# Patient Record
Sex: Male | Born: 1965 | Race: White | Hispanic: No | Smoking: Former smoker
Health system: Southern US, Community
[De-identification: ages and names within clinical notes are randomized; demographics above are authoritative.]

## PROBLEM LIST (undated history)

## (undated) DIAGNOSIS — R51 Headache: Secondary | ICD-10-CM

## (undated) DIAGNOSIS — G43909 Migraine, unspecified, not intractable, without status migrainosus: Secondary | ICD-10-CM

## (undated) DIAGNOSIS — M5020 Other cervical disc displacement, unspecified cervical region: Secondary | ICD-10-CM

## (undated) DIAGNOSIS — K219 Gastro-esophageal reflux disease without esophagitis: Secondary | ICD-10-CM

## (undated) DIAGNOSIS — M1990 Unspecified osteoarthritis, unspecified site: Secondary | ICD-10-CM

## (undated) HISTORY — PX: REPAIR QUADRICEPS / HAMSTRING MUSCLE: SUR1204

## (undated) HISTORY — PX: GANGLION CYST EXCISION: SHX1691

## (undated) HISTORY — PX: CARPAL TUNNEL RELEASE: SHX101

## (undated) HISTORY — DX: Migraine, unspecified, not intractable, without status migrainosus: G43.909

---

## 1993-02-27 HISTORY — PX: SEPTOPLASTY: SUR1290

## 2009-02-27 HISTORY — PX: FOOT ARTHROPLASTY: SHX1657

## 2010-06-09 ENCOUNTER — Inpatient Hospital Stay (INDEPENDENT_AMBULATORY_CARE_PROVIDER_SITE_OTHER)
Admission: RE | Admit: 2010-06-09 | Discharge: 2010-06-09 | Disposition: A | Discharge status: Home / Self Care | Payer: BLUE CROSS/BLUE SHIELD | Source: Ambulatory Visit | Attending: Family Medicine | Admitting: Family Medicine

## 2010-06-09 DIAGNOSIS — A6000 Herpesviral infection of urogenital system, unspecified: Secondary | ICD-10-CM

## 2011-02-28 HISTORY — PX: SHOULDER ARTHROSCOPY W/ ROTATOR CUFF REPAIR: SHX2400

## 2012-02-28 HISTORY — PX: KNEE ARTHROSCOPY: SUR90

## 2012-02-28 HISTORY — PX: REPAIR QUADRICEPS / HAMSTRING MUSCLE: SUR1204

## 2012-11-18 ENCOUNTER — Encounter (HOSPITAL_BASED_OUTPATIENT_CLINIC_OR_DEPARTMENT_OTHER): Payer: Self-pay | Admitting: *Deleted

## 2012-11-18 NOTE — Progress Notes (Signed)
No labs needed

## 2012-11-20 ENCOUNTER — Other Ambulatory Visit: Payer: Self-pay | Admitting: Orthopedic Surgery

## 2012-11-21 ENCOUNTER — Encounter (HOSPITAL_BASED_OUTPATIENT_CLINIC_OR_DEPARTMENT_OTHER)
Admission: RE | Disposition: A | Discharge status: Home / Self Care | Payer: Self-pay | Source: Ambulatory Visit | Attending: Orthopedic Surgery

## 2012-11-21 ENCOUNTER — Encounter (HOSPITAL_BASED_OUTPATIENT_CLINIC_OR_DEPARTMENT_OTHER): Payer: Self-pay | Admitting: Certified Registered Nurse Anesthetist

## 2012-11-21 ENCOUNTER — Ambulatory Visit (HOSPITAL_BASED_OUTPATIENT_CLINIC_OR_DEPARTMENT_OTHER): Payer: BC Managed Care – PPO | Admitting: Certified Registered Nurse Anesthetist

## 2012-11-21 ENCOUNTER — Ambulatory Visit (HOSPITAL_BASED_OUTPATIENT_CLINIC_OR_DEPARTMENT_OTHER)
Admission: RE | Admit: 2012-11-21 | Discharge: 2012-11-21 | Disposition: A | Discharge status: Home / Self Care | Payer: BC Managed Care – PPO | Source: Ambulatory Visit | Attending: Orthopedic Surgery | Admitting: Orthopedic Surgery

## 2012-11-21 ENCOUNTER — Encounter (HOSPITAL_BASED_OUTPATIENT_CLINIC_OR_DEPARTMENT_OTHER): Payer: Self-pay | Admitting: *Deleted

## 2012-11-21 DIAGNOSIS — S92902K Unspecified fracture of left foot, subsequent encounter for fracture with nonunion: Secondary | ICD-10-CM

## 2012-11-21 DIAGNOSIS — IMO0002 Reserved for concepts with insufficient information to code with codable children: Secondary | ICD-10-CM | POA: Insufficient documentation

## 2012-11-21 HISTORY — PX: ORIF TOE FRACTURE: SHX5032

## 2012-11-21 HISTORY — DX: Headache: R51

## 2012-11-21 LAB — POCT HEMOGLOBIN-HEMACUE: Hemoglobin: 14.8 g/dL (ref 13.0–17.0)

## 2012-11-21 SURGERY — OPEN REDUCTION INTERNAL FIXATION (ORIF) METATARSAL (TOE) FRACTURE
Anesthesia: General | Site: Foot | Laterality: Left | Wound class: Clean

## 2012-11-21 MED ORDER — OXYCODONE HCL 5 MG PO TABS: 5.0000 mg | ORAL_TABLET | Freq: Once | ORAL | Status: DC | PRN

## 2012-11-21 MED ORDER — ROPIVACAINE HCL 5 MG/ML IJ SOLN
INTRAMUSCULAR | Status: DC | PRN
  Administered 2012-11-21: 10 mL

## 2012-11-21 MED ORDER — BUPIVACAINE-EPINEPHRINE 0.5% -1:200000 IJ SOLN
INTRAMUSCULAR | Status: DC | PRN
  Administered 2012-11-21: 10 mL

## 2012-11-21 MED ORDER — FENTANYL CITRATE 0.05 MG/ML IJ SOLN
50.0000 ug | INTRAMUSCULAR | Status: DC | PRN
  Administered 2012-11-21: 100 ug via INTRAVENOUS

## 2012-11-21 MED ORDER — ONDANSETRON HCL 4 MG/2ML IJ SOLN
INTRAMUSCULAR | Status: DC | PRN
  Administered 2012-11-21: 4 mg via INTRAVENOUS

## 2012-11-21 MED ORDER — ACETAMINOPHEN 500 MG PO TABS
1000.0000 mg | ORAL_TABLET | Freq: Once | ORAL | Status: AC
  Administered 2012-11-21: 1000 mg via ORAL

## 2012-11-21 MED ORDER — 0.9 % SODIUM CHLORIDE (POUR BTL) OPTIME
TOPICAL | Status: DC | PRN
  Administered 2012-11-21: 200 mL

## 2012-11-21 MED ORDER — BUPIVACAINE-EPINEPHRINE PF 0.5-1:200000 % IJ SOLN
INTRAMUSCULAR | Status: DC | PRN
  Administered 2012-11-21: 30 mL

## 2012-11-21 MED ORDER — BACITRACIN ZINC 500 UNIT/GM EX OINT
TOPICAL_OINTMENT | CUTANEOUS | Status: DC | PRN
  Administered 2012-11-21: 1 via TOPICAL

## 2012-11-21 MED ORDER — MIDAZOLAM HCL 5 MG/5ML IJ SOLN
INTRAMUSCULAR | Status: DC | PRN
  Administered 2012-11-21: 2 mg via INTRAVENOUS

## 2012-11-21 MED ORDER — CHLORHEXIDINE GLUCONATE 4 % EX LIQD: 60.0000 mL | Freq: Once | CUTANEOUS | Status: DC

## 2012-11-21 MED ORDER — MIDAZOLAM HCL 2 MG/2ML IJ SOLN
1.0000 mg | INTRAMUSCULAR | Status: DC | PRN
  Administered 2012-11-21: 2 mg via INTRAVENOUS

## 2012-11-21 MED ORDER — PROPOFOL 10 MG/ML IV BOLUS
INTRAVENOUS | Status: DC | PRN
  Administered 2012-11-21: 300 mg via INTRAVENOUS

## 2012-11-21 MED ORDER — HYDROMORPHONE HCL PF 1 MG/ML IJ SOLN: 0.2500 mg | INTRAMUSCULAR | Status: DC | PRN

## 2012-11-21 MED ORDER — OXYCODONE HCL 5 MG/5ML PO SOLN: 5.0000 mg | Freq: Once | ORAL | Status: DC | PRN

## 2012-11-21 MED ORDER — DEXAMETHASONE SODIUM PHOSPHATE 10 MG/ML IJ SOLN
INTRAMUSCULAR | Status: DC | PRN
  Administered 2012-11-21: 10 mg via INTRAVENOUS

## 2012-11-21 MED ORDER — DEXAMETHASONE SODIUM PHOSPHATE 4 MG/ML IJ SOLN
INTRAMUSCULAR | Status: DC | PRN
  Administered 2012-11-21: 8 mg

## 2012-11-21 MED ORDER — DEXTROSE 5 % IV SOLN
3.0000 g | INTRAVENOUS | Status: AC
  Administered 2012-11-21: 3 g via INTRAVENOUS

## 2012-11-21 MED ORDER — FENTANYL CITRATE 0.05 MG/ML IJ SOLN
INTRAMUSCULAR | Status: DC | PRN
  Administered 2012-11-21 (×2): 50 ug via INTRAVENOUS

## 2012-11-21 MED ORDER — CEFAZOLIN SODIUM-DEXTROSE 2-3 GM-% IV SOLR: 2.0000 g | INTRAVENOUS | Status: DC

## 2012-11-21 MED ORDER — LACTATED RINGERS IV SOLN
INTRAVENOUS | Status: DC
  Administered 2012-11-21 (×2): via INTRAVENOUS

## 2012-11-21 MED ORDER — SODIUM CHLORIDE 0.9 % IV SOLN: INTRAVENOUS | Status: DC

## 2012-11-21 MED ORDER — LIDOCAINE HCL (CARDIAC) 20 MG/ML IV SOLN
INTRAVENOUS | Status: DC | PRN
  Administered 2012-11-21: 60 mg via INTRAVENOUS

## 2012-11-21 MED ORDER — ONDANSETRON HCL 4 MG/2ML IJ SOLN: 4.0000 mg | Freq: Once | INTRAMUSCULAR | Status: DC | PRN

## 2012-11-21 MED ORDER — OXYCODONE HCL 5 MG PO TABS: 5.0000 mg | ORAL_TABLET | ORAL | Status: DC | PRN

## 2012-11-21 SURGICAL SUPPLY — 77 items
BAG DECANTER FOR FLEXI CONT (MISCELLANEOUS) ×2 IMPLANT
BANDAGE ESMARK 6X9 LF (GAUZE/BANDAGES/DRESSINGS) ×1 IMPLANT
BIT DRILL 1.7 (BIT) ×2 IMPLANT
BLADE SURG 15 STRL LF DISP TIS (BLADE) ×2 IMPLANT
BLADE SURG 15 STRL SS (BLADE) ×2
BNDG COHESIVE 4X5 TAN STRL (GAUZE/BANDAGES/DRESSINGS) ×2 IMPLANT
BNDG COHESIVE 6X5 TAN STRL LF (GAUZE/BANDAGES/DRESSINGS) ×2 IMPLANT
BNDG ESMARK 4X9 LF (GAUZE/BANDAGES/DRESSINGS) IMPLANT
BNDG ESMARK 6X9 LF (GAUZE/BANDAGES/DRESSINGS) ×2
CAP PIN ORTHO PINK (CAP) IMPLANT
CHLORAPREP W/TINT 26ML (MISCELLANEOUS) ×2 IMPLANT
CLOTH BEACON ORANGE TIMEOUT ST (SAFETY) ×2 IMPLANT
COVER TABLE BACK 60X90 (DRAPES) ×2 IMPLANT
CUFF TOURNIQUET SINGLE 34IN LL (TOURNIQUET CUFF) ×2 IMPLANT
DECANTER SPIKE VIAL GLASS SM (MISCELLANEOUS) IMPLANT
DRAPE C-ARM 42X72 X-RAY (DRAPES) IMPLANT
DRAPE C-ARMOR (DRAPES) IMPLANT
DRAPE EXTREMITY T 121X128X90 (DRAPE) ×2 IMPLANT
DRAPE OEC MINIVIEW 54X84 (DRAPES) ×2 IMPLANT
DRAPE U-SHAPE 47X51 STRL (DRAPES) ×2 IMPLANT
DRILL BIT 2.4 ×2 IMPLANT
DRSG EMULSION OIL 3X3 NADH (GAUZE/BANDAGES/DRESSINGS) ×2 IMPLANT
DRSG PAD ABDOMINAL 8X10 ST (GAUZE/BANDAGES/DRESSINGS) ×2 IMPLANT
ELECT REM PT RETURN 9FT ADLT (ELECTROSURGICAL) ×2
ELECTRODE REM PT RTRN 9FT ADLT (ELECTROSURGICAL) ×1 IMPLANT
GLOVE BIO SURGEON STRL SZ8 (GLOVE) ×2 IMPLANT
GLOVE BIOGEL PI IND STRL 7.0 (GLOVE) ×1 IMPLANT
GLOVE BIOGEL PI IND STRL 7.5 (GLOVE) ×1 IMPLANT
GLOVE BIOGEL PI IND STRL 8 (GLOVE) ×1 IMPLANT
GLOVE BIOGEL PI INDICATOR 7.0 (GLOVE) ×1
GLOVE BIOGEL PI INDICATOR 7.5 (GLOVE) ×1
GLOVE BIOGEL PI INDICATOR 8 (GLOVE) ×1
GLOVE ECLIPSE 6.5 STRL STRAW (GLOVE) ×2 IMPLANT
GLOVE ECLIPSE 7.0 STRL STRAW (GLOVE) ×2 IMPLANT
GLOVE EXAM NITRILE MD LF STRL (GLOVE) ×2 IMPLANT
GOWN PREVENTION PLUS XLARGE (GOWN DISPOSABLE) ×2 IMPLANT
GOWN PREVENTION PLUS XXLARGE (GOWN DISPOSABLE) ×2 IMPLANT
K-WIRE .054X4 (WIRE) ×2 IMPLANT
NEEDLE HYPO 22GX1.5 SAFETY (NEEDLE) IMPLANT
NEEDLE HYPO 25X1 1.5 SAFETY (NEEDLE) ×2 IMPLANT
PACK BASIN DAY SURGERY FS (CUSTOM PROCEDURE TRAY) ×2 IMPLANT
PAD CAST 4YDX4 CTTN HI CHSV (CAST SUPPLIES) ×1 IMPLANT
PADDING CAST ABS 4INX4YD NS (CAST SUPPLIES)
PADDING CAST ABS COTTON 4X4 ST (CAST SUPPLIES) IMPLANT
PADDING CAST COTTON 4X4 STRL (CAST SUPPLIES) ×1
PADDING CAST COTTON 6X4 STRL (CAST SUPPLIES) ×2 IMPLANT
PENCIL BUTTON HOLSTER BLD 10FT (ELECTRODE) ×2 IMPLANT
PLATE LP T 2.4MM 6H (Plate) ×2 IMPLANT
SANITIZER HAND PURELL 535ML FO (MISCELLANEOUS) ×2 IMPLANT
SCREW CORTICAL 2.4X14 (Screw) ×2 IMPLANT
SCREW LP CORTEX 2.4X20MM (Screw) ×2 IMPLANT
SCREW LP CORTEX 2.4X28MM (Screw) ×2 IMPLANT
SCREW LP LOCKING 2.4X12MM (Screw) ×4 IMPLANT
SCREW LP LOCKING 2.4X18MM (Screw) ×2 IMPLANT
SCREW LP LOCKING 2.4X20MM (Screw) ×2 IMPLANT
SHEET MEDIUM DRAPE 40X70 STRL (DRAPES) ×2 IMPLANT
SLEEVE SCD COMPRESS KNEE MED (MISCELLANEOUS) ×2 IMPLANT
SPLINT FAST PLASTER 5X30 (CAST SUPPLIES) ×20
SPLINT PLASTER CAST FAST 5X30 (CAST SUPPLIES) ×20 IMPLANT
SPONGE GAUZE 4X4 12PLY (GAUZE/BANDAGES/DRESSINGS) ×2 IMPLANT
SPONGE LAP 18X18 X RAY DECT (DISPOSABLE) ×2 IMPLANT
STOCKINETTE 6  STRL (DRAPES) ×1
STOCKINETTE 6 STRL (DRAPES) ×1 IMPLANT
STRIP CLOSURE SKIN 1/2X4 (GAUZE/BANDAGES/DRESSINGS) IMPLANT
SUCTION FRAZIER TIP 10 FR DISP (SUCTIONS) ×2 IMPLANT
SUT ETHILON 3 0 PS 1 (SUTURE) ×2 IMPLANT
SUT MNCRL AB 4-0 PS2 18 (SUTURE) IMPLANT
SUT VIC AB 2-0 SH 18 (SUTURE) IMPLANT
SUT VIC AB 2-0 SH 27 (SUTURE) ×1
SUT VIC AB 2-0 SH 27XBRD (SUTURE) ×1 IMPLANT
SUT VICRYL 4-0 PS2 18IN ABS (SUTURE) IMPLANT
SYR BULB 3OZ (MISCELLANEOUS) ×2 IMPLANT
SYR CONTROL 10ML LL (SYRINGE) ×2 IMPLANT
TOWEL OR 17X24 6PK STRL BLUE (TOWEL DISPOSABLE) ×2 IMPLANT
TOWEL OR NON WOVEN STRL DISP B (DISPOSABLE) ×2 IMPLANT
TUBE CONNECTING 20X1/4 (TUBING) ×2 IMPLANT
UNDERPAD 30X30 INCONTINENT (UNDERPADS AND DIAPERS) ×2 IMPLANT

## 2012-11-21 NOTE — H&P (Signed)
Vincent Hernandez is an 47 y.o. male.   Chief Complaint: left foot pain HPI: 47 y/o male with left foot pain since a fracture in the remote past.  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  He presents now for ORIF with calcaneal bone graft.  Past Medical History  Diagnosis Date  . Medical history non-contributory   . ZOXWRUEA(540.9)     Past Surgical History  Procedure Laterality Date  . Knee arthroscopy  1/14    right  . Shoulder arthroscopy w/ rotator cuff repair  2013    left  . Septoplasty  1995  . Foot arthroplasty  2011    right-screws    History reviewed. No pertinent family history. Social History:  reports that he has never smoked. He does not have any smokeless tobacco history on file. He reports that  drinks alcohol. He reports that he does not use illicit drugs.  Allergies: No Known Allergies  Medications Prior to Admission  Medication Sig Dispense Refill  . B Complex-C (B-COMPLEX WITH VITAMIN C) tablet Take 1 tablet by mouth daily.      . fish oil-omega-3 fatty acids 1000 MG capsule Take 2 g by mouth daily.      . naproxen sodium (ANAPROX) 220 MG tablet Take 220 mg by mouth 2 (two) times daily with a meal.      . SUMAtriptan (IMITREX) 50 MG tablet Take 50 mg by mouth every 2 (two) hours as needed for migraine. May repeat in 2 hours if headache persists or recurs.        Results for orders placed during the hospital encounter of 12/08/2012 (from the past 48 hour(s))  POCT HEMOGLOBIN-HEMACUE     Status: None   Collection Time    2012-12-08  6:41 AM      Result Value Range   Hemoglobin 14.8  13.0 - 17.0 g/dL   No results found.  ROS  No recent f/c/n/v/wt loss  Blood pressure 131/82, pulse 74, temperature 97.9 F (36.6 C), temperature source Oral, resp. rate 18, height 5\' 10"  (1.778 m), weight 121.791 kg (268 lb 8 oz), SpO2 96.00%. Physical Exam  wn wd male in nad.  A and O x 4.  Mood and affect normal.  EOMI.  Resp unlabored.  L foot with healthy  skin.  TTP at 4th MT base.  Sens to LT intact at foot.  2+ dp and pt pulses.  5/5 srength in DF and PF of the ankle.  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  The risks and benefits of the alternative treatment options have been discussed in detail.  The patient wishes to proceed with surgery and specifically understands risks of bleeding, infection, nerve damage, blood clots, need for additional surgery, amputation and death.   Toni Arthurs 12/08/2012, 7:18 AM

## 2012-11-21 NOTE — Progress Notes (Signed)
Assisted Dr. Crews with left, ultrasound guided, popliteal/saphenous block. Side rails up, monitors on throughout procedure. See vital signs in flow sheet. Tolerated Procedure well. 

## 2012-11-21 NOTE — Anesthesia Preprocedure Evaluation (Addendum)
Anesthesia Evaluation  Patient identified by MRN, date of birth, ID band Patient awake    Reviewed: Allergy & Precautions, NPO status   Airway Mallampati: I TM Distance: >3 FB Neck ROM: Full    Dental  (+) Teeth Intact and Dental Advisory Given   Pulmonary  breath sounds clear to auscultation        Cardiovascular Rhythm:Regular Rate:Normal     Neuro/Psych  Headaches,    GI/Hepatic   Endo/Other  Morbid obesity  Renal/GU      Musculoskeletal   Abdominal   Peds  Hematology   Anesthesia Other Findings   Reproductive/Obstetrics                          Anesthesia Physical Anesthesia Plan  ASA: II  Anesthesia Plan: General   Post-op Pain Management:    Induction: Intravenous  Airway Management Planned: LMA  Additional Equipment:   Intra-op Plan:   Post-operative Plan: Extubation in OR  Informed Consent: I have reviewed the patients History and Physical, chart, labs and discussed the procedure including the risks, benefits and alternatives for the proposed anesthesia with the patient or authorized representative who has indicated his/her understanding and acceptance.   Dental advisory given  Plan Discussed with: CRNA, Anesthesiologist and Surgeon  Anesthesia Plan Comments:         Anesthesia Quick Evaluation

## 2012-11-21 NOTE — Brief Op Note (Signed)
11/21/2012  9:31 AM  PATIENT:  Vincent Hernandez  47 y.o. male  PRE-OPERATIVE DIAGNOSIS:  Left Fourth Metatarsal Nonunion  POST-OPERATIVE DIAGNOSIS:  Left Fourth Metatarsal Nonunion  Procedure(s): 1.  Treatment of left 4th metatarsal nonunion with open reduction and internal fixation 2.  AP, lateral and oblique radiographs of the left foot  SURGEON:  Toni Arthurs, MD  ASSISTANT: n/a  ANESTHESIA:   General, regional  EBL:  minimal   TOURNIQUET:   Total Tourniquet Time Documented: Thigh (Left) - 77 minutes Total: Thigh (Left) - 77 minutes   COMPLICATIONS:  None apparent  DISPOSITION:  Extubated, awake and stable to recovery.  DICTATION ID:  [id number]

## 2012-11-21 NOTE — Anesthesia Postprocedure Evaluation (Signed)
  Anesthesia Post-op Note  Patient: Vincent Hernandez  Procedure(s) Performed: Procedure(s): OPEN REDUCTION INTERNAL FIXATION (ORIF) LEFT FOURTH METATARSAL NONUNION FRACTURE (Left)  Patient Location: PACU  Anesthesia Type:GA combined with regional for post-op pain  Level of Consciousness: awake, alert  and oriented  Airway and Oxygen Therapy: Patient Spontanous Breathing  Post-op Pain: none  Post-op Assessment: Post-op Vital signs reviewed  Post-op Vital Signs: Reviewed  Complications: No apparent anesthesia complications

## 2012-11-21 NOTE — Transfer of Care (Signed)
Immediate Anesthesia Transfer of Care Note  Patient: Vincent Hernandez  Procedure(s) Performed: Procedure(s): OPEN REDUCTION INTERNAL FIXATION (ORIF) LEFT FOURTH METATARSAL NONUNION FRACTURE (Left)  Patient Location: PACU  Anesthesia Type:GA combined with regional for post-op pain  Level of Consciousness: awake and patient cooperative  Airway & Oxygen Therapy: Patient Spontanous Breathing and Patient connected to face mask oxygen  Post-op Assessment: Report given to PACU RN and Post -op Vital signs reviewed and stable  Post vital signs: Reviewed and stable  Complications: No apparent anesthesia complications

## 2012-11-21 NOTE — Anesthesia Procedure Notes (Addendum)
Anesthesia Regional Block:  Popliteal block  Pre-Anesthetic Checklist: ,, timeout performed, Correct Patient, Correct Site, Correct Laterality, Correct Procedure, Correct Position, site marked, Risks and benefits discussed,  Surgical consent,  Pre-op evaluation,  At surgeon's request and post-op pain management  Laterality: Left and Lower  Prep: chloraprep       Needles:  Injection technique: Single-shot  Needle Type: Echogenic Needle     Needle Length: 9cm  Needle Gauge: 21 and 21 G    Additional Needles:  Procedures: ultrasound guided (picture in chart) Popliteal block Narrative:  Start time: 11/21/2012 7:10 AM End time: 11/21/2012 7:24 AM Injection made incrementally with aspirations every 5 mL.  Performed by: Personally  Anesthesiologist: Sheldon Silvan, MD  Additional Notes: After the Popliteal block was complete, a L Saphenous block was done in a similar fashion.  Popliteal block Procedure Name: LMA Insertion Date/Time: 11/21/2012 7:35 AM Performed by: Audrinna Sherman D Pre-anesthesia Checklist: Patient identified, Emergency Drugs available, Suction available and Patient being monitored Patient Re-evaluated:Patient Re-evaluated prior to inductionOxygen Delivery Method: Circle System Utilized Preoxygenation: Pre-oxygenation with 100% oxygen Intubation Type: IV induction Ventilation: Mask ventilation without difficulty LMA: LMA inserted LMA Size: 5.0 Number of attempts: 1 Airway Equipment and Method: bite block Placement Confirmation: positive ETCO2 Tube secured with: Tape Dental Injury: Teeth and Oropharynx as per pre-operative assessment

## 2012-11-22 NOTE — Op Note (Signed)
NAME:  Vincent Hernandez, Vincent Hernandez NO.:  [id number]  MEDICAL RECORD NO.:  [id number]  LOCATION:                                 FACILITY:  PHYSICIAN:  Toni Arthurs, MD             DATE OF BIRTH:  DATE OF PROCEDURE:  11/21/2012 DATE OF DISCHARGE:                              OPERATIVE REPORT   PREOPERATIVE DIAGNOSIS:  Left fourth metatarsal nonunion.  POSTOPERATIVE DIAGNOSIS:  Left fourth metatarsal nonunion.  PROCEDURE: 1. Treatment of left fourth metatarsal nonunion with open reduction     and internal fixation. 2. Anteroposterior, lateral, and oblique radiographs of the left foot.  SURGEON:  Toni Arthurs, MD  ANESTHESIA:  General, regional.  ESTIMATED BLOOD LOSS:  Minimal.  TOURNIQUET TIME:  77 minutes 220 mmHg.  COMPLICATIONS:  None apparent.  DISPOSITION:  Extubated awake and stable to recovery.  INDICATIONS FOR PROCEDURE:  The patient is a 47 year old male who had a crush injury to his left foot over a year ago.  He has persistent pain in that area.  He has radiographic and MRI evidence of a nonunion of the fourth metatarsal fracture.  He presents now for treatment of this nonunion with open reduction and internal fixation.  He understands the risks and benefits, the alternative treatment options and elects surgical treatment.  He specifically understands the risks of bleeding, infection, nerve damage, blood clots, need for additional surgery, amputation, and death.  PROCEDURE IN DETAIL:  After preoperative consent was obtained and the correct operative site was identified, the patient was brought to the operating room and placed supine on the operating table.  General anesthesia was induced.  Preoperative antibiotics were administered. Surgical time-out was taken.  The left lower extremity was prepped and draped in standard sterile fashion with the tourniquet around the thigh. The extremity was exsanguinated and the tourniquet was inflated to 220 mmHg.   A longitudinal incision was made over the fourth metatarsal shaft.  Sharp dissection was carried down through the skin and subcutaneous tissue.  Blunt dissection was then carried down through the extensor tendons to expose the site of the nonunion.  The tip of #15 blade was used to identify the nonunion site.  The nonunion was cleaned of all fibrous tissue with a rongeur and the curette.  The bone was contoured to allow application of a plate dorsally across the fracture site.  The fracture site was mobilized.  The wound was irrigated copiously.  A small drill bit was used to perforate the fracture callus on both sides of the nonunion site, leaving the resultant bone graft in place.  The fracture was reduced and held with a tenaculum.  AP, lateral, and oblique fluoroscopic radiographs showed appropriate alignment of the fracture site.  A 2.4 mm lag screw was then inserted across the fracture site.  It was noted to have excellent purchase.  A 7- hole T-plate from the [employer] foot plate set was selected.  It was fixed proximally with a nonlocking screw and distally with a nonlocking screw after contouring the plate appropriately.  These screws pulled the plate securely down to the bone.  The 2 holes  in the T-portion of the plate were then drilled and filled with locking screws.  The remaining 2 holes in the distal portion of the plate were drilled and filled with locking screws in bicortical fashion.  The compression slot was left empty over the lag screw.  The wound was again irrigated copiously.  AP, lateral, and oblique radiograph showed appropriate position and length of all hardware and appropriate reduction  of the nonunion site.  A bone graft that was removed from the fracture callus was then packed in the remaining gaps in the fracture site.  Subcutaneous tissue was approximated with inverted simple sutures of 3-0 Monocryl and a running 3-0 nylon was used to close the skin incision.   Sterile dressings were applied, followed by a well-padded short-leg splint.  Tourniquet was released at 77 minutes after application of the dressings.  The patient was awakened from Anesthesia and transported to the recovery room in stable condition.  The skin had been infiltrated with 0.5% plain Marcaine after closure of the wound.  FOLLOWUP PLAN:  The patient will be nonweightbearing on the left lower extremity.  He will follow up with me in 2 weeks for suture removal and conversion to a cast.  RADIOGRAPHS:  AP, lateral, and oblique radiographs of the left foot were obtained today intraoperatively.  These show interval reduction of the nonunion site with application of a lag screw and a neutralization plate.  No other fracture or dislocation or malalignment is noted.     Toni Arthurs, MD     JH/MEDQ  D:  11/21/2012  T:  11/22/2012  Job:  [id number]

## 2012-11-26 ENCOUNTER — Encounter (HOSPITAL_BASED_OUTPATIENT_CLINIC_OR_DEPARTMENT_OTHER): Payer: Self-pay | Admitting: Orthopedic Surgery

## 2014-02-27 HISTORY — PX: MENISCUS REPAIR: SHX5179

## 2015-07-22 ENCOUNTER — Emergency Department (HOSPITAL_COMMUNITY): Payer: 59

## 2015-07-22 ENCOUNTER — Encounter (HOSPITAL_COMMUNITY): Payer: Self-pay | Admitting: Emergency Medicine

## 2015-07-22 ENCOUNTER — Emergency Department (HOSPITAL_COMMUNITY)
Admission: EM | Admit: 2015-07-22 | Discharge: 2015-07-23 | Disposition: A | Discharge status: Home / Self Care | Payer: 59 | Attending: Emergency Medicine | Admitting: Emergency Medicine

## 2015-07-22 DIAGNOSIS — R079 Chest pain, unspecified: Secondary | ICD-10-CM | POA: Insufficient documentation

## 2015-07-22 DIAGNOSIS — Z79899 Other long term (current) drug therapy: Secondary | ICD-10-CM | POA: Diagnosis not present

## 2015-07-22 DIAGNOSIS — Z7982 Long term (current) use of aspirin: Secondary | ICD-10-CM | POA: Insufficient documentation

## 2015-07-22 DIAGNOSIS — M7989 Other specified soft tissue disorders: Secondary | ICD-10-CM | POA: Insufficient documentation

## 2015-07-22 DIAGNOSIS — R110 Nausea: Secondary | ICD-10-CM | POA: Insufficient documentation

## 2015-07-22 DIAGNOSIS — G43909 Migraine, unspecified, not intractable, without status migrainosus: Secondary | ICD-10-CM | POA: Diagnosis not present

## 2015-07-22 DIAGNOSIS — R350 Frequency of micturition: Secondary | ICD-10-CM | POA: Diagnosis not present

## 2015-07-22 DIAGNOSIS — M546 Pain in thoracic spine: Secondary | ICD-10-CM | POA: Diagnosis not present

## 2015-07-22 DIAGNOSIS — M542 Cervicalgia: Secondary | ICD-10-CM | POA: Insufficient documentation

## 2015-07-22 DIAGNOSIS — I7100 Dissection of unspecified site of aorta: Secondary | ICD-10-CM

## 2015-07-22 DIAGNOSIS — M549 Dorsalgia, unspecified: Secondary | ICD-10-CM

## 2015-07-22 LAB — BASIC METABOLIC PANEL
ANION GAP: 8 (ref 5–15)
BUN: 17 mg/dL (ref 6–20)
CALCIUM: 9.1 mg/dL (ref 8.9–10.3)
CO2: 25 mmol/L (ref 22–32)
CREATININE: 0.84 mg/dL (ref 0.61–1.24)
Chloride: 103 mmol/L (ref 101–111)
GFR calc non Af Amer: >60 mL/min (ref >60)
Glucose, Bld: 88 mg/dL (ref 65–99)
Potassium: 3.9 mmol/L (ref 3.5–5.1)
Sodium: 136 mmol/L (ref 135–145)

## 2015-07-22 LAB — I-STAT TROPONIN, ED: Troponin i, poc: 0 ng/mL (ref 0.00–0.08)

## 2015-07-22 LAB — CBC
HCT: 42.6 % (ref 39.0–52.0)
Hemoglobin: 15 g/dL (ref 13.0–17.0)
MCH: 31.8 pg (ref 26.0–34.0)
MCHC: 35.2 g/dL (ref 30.0–36.0)
MCV: 90.4 fL (ref 78.0–100.0)
PLATELETS: 281 10*3/uL (ref 150–400)
RBC: 4.71 MIL/uL (ref 4.22–5.81)
RDW: 12.7 % (ref 11.5–15.5)
WBC: 9.7 10*3/uL (ref 4.0–10.5)

## 2015-07-22 MED ORDER — ASPIRIN 81 MG PO CHEW
325.0000 mg | CHEWABLE_TABLET | Freq: Once | ORAL | Status: AC
  Administered 2015-07-22: 325 mg via ORAL
  Filled 2015-07-22: qty 5

## 2015-07-22 MED ORDER — SODIUM CHLORIDE 0.9 % IV BOLUS (SEPSIS)
1000.0000 mL | Freq: Once | INTRAVENOUS | Status: AC
  Administered 2015-07-22: 1000 mL via INTRAVENOUS

## 2015-07-22 MED ORDER — MORPHINE SULFATE (PF) 2 MG/ML IV SOLN: 2.0000 mg | Freq: Once | INTRAVENOUS | Status: DC

## 2015-07-22 MED ORDER — NITROGLYCERIN 0.4 MG SL SUBL
0.4000 mg | SUBLINGUAL_TABLET | SUBLINGUAL | Status: DC | PRN
  Administered 2015-07-22 (×2): 0.4 mg via SUBLINGUAL
  Filled 2015-07-22: qty 1

## 2015-07-22 MED ORDER — MORPHINE SULFATE (PF) 2 MG/ML IV SOLN
2.0000 mg | Freq: Once | INTRAVENOUS | Status: AC
  Administered 2015-07-22: 2 mg via INTRAVENOUS
  Filled 2015-07-22: qty 1

## 2015-07-22 MED ORDER — KETOROLAC TROMETHAMINE 30 MG/ML IJ SOLN
30.0000 mg | Freq: Once | INTRAMUSCULAR | Status: AC
  Administered 2015-07-23: 30 mg via INTRAVENOUS
  Filled 2015-07-22: qty 1

## 2015-07-22 NOTE — ED Provider Notes (Signed)
CSN: [id number]     Arrival date & time 07/22/15  1903 History   First MD Initiated Contact with Patient 07/22/15 1922     Chief Complaint  Patient presents with  . Chest Pain     (Consider location/radiation/quality/duration/timing/severity/associated sxs/prior Treatment) HPI Comments: Vincent Hernandez is a 50 y.o. male presents to ED with complaint of left-sided chest pain. Patient states pain initially started three days ago in left shoulder and upper back, was intermittent in nature, and occasionally sharp. Today, patient states pain radiated into left chest. Pain is constant 4/10 described as a tightness and squeezing sensation. Associated symptoms include left-sided neck pain and nausea. Patient denies fever, chills, night sweats. He denies shortness of breath and cough. Of note patient endorses migraine headache that started 4 days ago. Headache was gradual in onset, 5-6/10, with photophobia. Patient states very typical for his migraine headaches. Denies paresthesias, weakness, or loss of consciousness. No dizziness or lightheadedness. He denies any cardiac history, hypertension, diabetes, or high cholesterol. Family history of father with stroke in 56s.   Patient is a 50 y.o. male presenting with chest pain. The history is provided by the patient and the spouse.  Chest Pain Associated symptoms: headache ( h/o migraine headache, gradual in onset, 5-6/10, very typical of his migraine headache) and nausea   Associated symptoms: no abdominal pain, no cough, no diaphoresis, no dizziness, no dysphagia, no fever, no numbness, no shortness of breath, not vomiting and no weakness     Past Medical History  Diagnosis Date  . Medical history non-contributory   . [id number])    Past Surgical History  Procedure Laterality Date  . Knee arthroscopy  1/14    right  . Shoulder arthroscopy w/ rotator cuff repair  2013    left  . Septoplasty  1995  . Foot arthroplasty  2011    right-screws  .  Orif toe fracture Left 11/21/2012    Procedure: OPEN REDUCTION INTERNAL FIXATION (ORIF) LEFT FOURTH METATARSAL NONUNION FRACTURE;  Surgeon: Wylene Simmer, MD;  Location: [facility];  Service: Orthopedics;  Laterality: Left;   No family history on file. Social History  Substance Use Topics  . Smoking status: Never Smoker   . Smokeless tobacco: None  . Alcohol Use: Yes     Comment: occ    Review of Systems  Constitutional: Negative for fever, chills and diaphoresis.  HENT: Negative for sore throat and trouble swallowing.   Eyes: Positive for photophobia ( secondary to migraine). Negative for visual disturbance.  Respiratory: Negative for cough and shortness of breath.   Cardiovascular: Positive for chest pain and leg swelling ( b/l).  Gastrointestinal: Positive for nausea. Negative for vomiting, abdominal pain, diarrhea, constipation and blood in stool.  Genitourinary: Positive for frequency. Negative for dysuria and hematuria.  Musculoskeletal: Positive for neck pain ( left). Negative for neck stiffness.  Skin: Negative for rash.  Allergic/Immunologic: Negative for immunocompromised state.  Neurological: Positive for headaches ( h/o migraine headache, gradual in onset, 5-6/10, very typical of his migraine headache). Negative for dizziness, syncope, weakness, light-headedness and numbness.      Allergies  Robaxin  Home Medications   Prior to Admission medications   Medication Sig Start Date End Date Taking? Authorizing Provider  aspirin 81 MG tablet Take 81 mg by mouth daily.   Yes Historical Provider, MD  aspirin-acetaminophen-caffeine (EXCEDRIN MIGRAINE) [phone] MG tablet Take 1 tablet by mouth every 6 (six) hours as needed for headache.   Yes Historical  Provider, MD  B Complex-C (B-COMPLEX WITH VITAMIN C) tablet Take 1 tablet by mouth daily.   Yes Historical Provider, MD  CRANBERRY PO Take by mouth.   Yes Historical Provider, MD  fish oil-omega-3 fatty acids  1000 MG capsule Take 2 g by mouth daily.   Yes Historical Provider, MD  Glucosamine HCl (GLUCOSAMINE PO) Take 1 tablet by mouth daily.   Yes Historical Provider, MD  Multiple Vitamin (MULTIVITAMIN WITH MINERALS) TABS tablet Take 1 tablet by mouth daily.   Yes Historical Provider, MD  SUMAtriptan (IMITREX) 50 MG tablet Take 50 mg by mouth every 2 (two) hours as needed for migraine. May repeat in 2 hours if headache persists or recurs.   Yes Historical Provider, MD  oxyCODONE (ROXICODONE) 5 MG immediate release tablet Take 1-2 tablets (5-10 mg total) by mouth every 4 (four) hours as needed for pain. Patient not taking: Reported on 07/22/2015 11/21/12   Wylene Simmer, MD   BP 90/61 mmHg  Pulse 64  Temp(Src) 98 F (36.7 C) (Oral)  Resp 17  Ht 5\' 10"  (1.778 m)  Wt 128.822 kg  BMI 40.75 kg/m2  SpO2 96% Physical Exam  Constitutional: He appears well-developed and well-nourished. No distress.  HENT:  Head: Normocephalic and atraumatic.  Mouth/Throat: Oropharynx is clear and moist. No oropharyngeal exudate.  Eyes: Conjunctivae and EOM are normal. Pupils are equal, round, and reactive to light. Right eye exhibits no discharge. Left eye exhibits no discharge. No scleral icterus.  Neck: Normal range of motion and phonation normal. Neck supple. No spinous process tenderness and no muscular tenderness present. No rigidity. Normal range of motion present.  Cardiovascular: Normal rate, regular rhythm, normal heart sounds and intact distal pulses.   No murmur heard. No carotid bruits heard. Pulses are 2+ and equal bilaterally in upper and lower extremities.   Pulmonary/Chest: Effort normal and breath sounds normal. No respiratory distress. He exhibits no tenderness.  Abdominal: Soft. Bowel sounds are normal. There is no tenderness. There is no rebound, no guarding and no CVA tenderness.  Musculoskeletal: Normal range of motion. He exhibits no tenderness.  Lymphadenopathy:    He has no cervical adenopathy.   Neurological: He is alert. Coordination normal.  Mental Status:  Alert, oriented, thought content appropriate, able to give a coherent history. Speech fluent without evidence of aphasia. Able to follow 2 step commands without difficulty.  Cranial Nerves:  II:  Peripheral visual fields grossly normal, pupils equal, round, reactive to light III,IV, VI: ptosis not present, extra-ocular motions intact bilaterally  V,VII: smile symmetric, facial light touch sensation equal VIII: hearing grossly normal to voice  X: uvula elevates symmetrically  XI: bilateral shoulder shrug symmetric and strong XII: midline tongue extension without fassiculations Motor:  Normal tone. 5/5 in upper and lower extremities bilaterally including strong and equal grip strength and dorsiflexion/plantar flexion Sensory: Pinprick and light touch normal in all extremities.  Cerebellar: normal finger-to-nose with bilateral upper extremities CV: distal pulses palpable throughout    Skin: Skin is warm and dry. He is not diaphoretic.  Psychiatric: He has a normal mood and affect. His behavior is normal.    ED Course  Procedures (including critical care time) Labs Review Labs Reviewed  BASIC METABOLIC PANEL  CBC  TROPONIN I  Randolm Idol, ED    Imaging Review Dg Chest 2 View  07/22/2015  CLINICAL DATA:  Left-sided chest wall pain. EXAM: CHEST  2 VIEW COMPARISON:  None. FINDINGS: Cardiomediastinal silhouette is normal. Mediastinal contours appear intact.  There is no evidence of focal airspace consolidation, pleural effusion or pneumothorax. Osseous structures are without acute abnormality. Soft tissues are grossly normal. IMPRESSION: No active cardiopulmonary disease. Electronically Signed   By: Fidela Salisbury M.D.   On: 07/22/2015 19:49   I have personally reviewed and evaluated these images and lab results as part of my medical decision-making.   EKG Interpretation   Date/Time:  Thursday Jul 22 2015  21:01:03 EDT Ventricular Rate:  73 PR Interval:  191 QRS Duration: 95 QT Interval:  388 QTC Calculation: 427 R Axis:   79 Text Interpretation:  Sinus rhythm Consider left atrial enlargement No  significant change since last tracing Confirmed by NGUYEN, EMILY ([id number])  on 07/22/2015 9:07:50 PM      MDM   Final diagnoses:  Chest pain, unspecified chest pain type  Left-sided back pain, unspecified location    Patient is afebrile and nontoxic. Vital signs are stable. BMP unremarkable. CBC unremarkable. Chest x-ray negative for consolidation, pleural effusion, or pneumothorax. Well's Criteria for PE is 0, doubt PE.. Doubt aortic dissection-equal pulses bilaterally no dilation of the aorta on chest x-ray. Doubt esophageal rupture-no free air under diaphragm. Troponin negative. EKG unremarkable. Heart score 3, risk of MACE 0.9-1.7%. 325mg  aspirin given. Will repeat troponin 3 hours.   Patient was given 2 doses of sublingual nitroglycerin. Patient reports worsening of pain. Described as sharp, located in the back between his spine and scapula. Patient endorsed some paresthesias in left arm. Patient given morphine. Endorsed improvement in pain. Patient given Toradol for headache. Given history of back pain and chest pain, will CT angiography to rule out dissection.  Repeat troponin negative. CT angiography negative for aortic aneurysm, aortic dissection, or pulmonary embolism. Possible musculoskeletal in nature. Will have patient follow up with primary care provider in the next 3 days. Discussed return precautions. Encourage follow-up with primary care doctor. Patient voiced understanding and is agreeable.       Roxanna Mew, PA-C 07/23/15 0231  Harvel Quale, MD 07/25/15 1455

## 2015-07-22 NOTE — ED Notes (Signed)
Pt. reports left chest pain onset this evening radiating to left shoulder , left axilla and left upper arm , denies SOB , no nausea or diaphoresis .

## 2015-07-23 ENCOUNTER — Emergency Department (HOSPITAL_COMMUNITY): Payer: 59

## 2015-07-23 ENCOUNTER — Encounter (HOSPITAL_COMMUNITY): Payer: Self-pay | Admitting: Radiology

## 2015-07-23 DIAGNOSIS — R079 Chest pain, unspecified: Secondary | ICD-10-CM | POA: Diagnosis not present

## 2015-07-23 LAB — TROPONIN I: Troponin I: 0.03 ng/mL (ref <0.031)

## 2015-07-23 MED ORDER — IOPAMIDOL (ISOVUE-370) INJECTION 76%
INTRAVENOUS | Status: AC
  Administered 2015-07-23: 100 mL
  Filled 2015-07-23: qty 100

## 2015-07-23 MED ORDER — NAPROXEN 500 MG PO TABS: 500.0000 mg | ORAL_TABLET | Freq: Two times a day (BID) | ORAL | Status: DC

## 2015-07-23 NOTE — ED Notes (Signed)
RN called CT scan to inquire about time of patient scan; CT staff reported 4 patient ahead of this patient; patient updated

## 2015-07-23 NOTE — Discharge Instructions (Signed)
Read the information below.   Your cardiac enzyme was negative. Your chest x-ray was negative for pneumonia, effusion, and pneumothorax. Your CT was negative for aortic dissection, aneurysm, or pulmonary embolus.  Use the prescribed medication as directed.  Please discuss all new medications with your pharmacist.  You are being prescribed naprosyn and anti-inflammatory and pain reliever. Do not take other NSAIDs (e.g. Motrin, aleeve, ibuprofen while taking naprosyn).  It is important for you to follow up with your primary care provider in the next 1-2 days for re-evaluation and possible referral to cardiology. It may be deemed that you need further evaluation and work up.  You may return to the Emergency Department at any time for worsening condition or any new symptoms that concern you. Return to the ED if you develop worsening chest pain, shortness of breath, unilateral leg swelling, neurologic symptoms such as slurred speech, facial droop, numbness/weakness.   Results for orders placed or performed during the hospital encounter of [id number]  Basic metabolic panel  Result Value Ref Range   Sodium 136 135 - 145 mmol/L   Potassium 3.9 3.5 - 5.1 mmol/L   Chloride 103 101 - 111 mmol/L   CO2 25 22 - 32 mmol/L   Glucose, Bld 88 65 - 99 mg/dL   BUN 17 6 - 20 mg/dL   Creatinine, Ser 0.84 0.61 - 1.24 mg/dL   Calcium 9.1 8.9 - 10.3 mg/dL   GFR calc non Af Amer >60 >60 mL/min   GFR calc Af Amer >60 >60 mL/min   Anion gap 8 5 - 15  CBC  Result Value Ref Range   WBC 9.7 4.0 - 10.5 K/uL   RBC 4.71 4.22 - 5.81 MIL/uL   Hemoglobin 15.0 13.0 - 17.0 g/dL   HCT 42.6 39.0 - 52.0 %   MCV 90.4 78.0 - 100.0 fL   MCH 31.8 26.0 - 34.0 pg   MCHC 35.2 30.0 - 36.0 g/dL   RDW 12.7 11.5 - 15.5 %   Platelets 281 150 - 400 K/uL  Troponin I  Result Value Ref Range   Troponin I 0.03 <0.031 ng/mL  I-stat troponin, ED  Result Value Ref Range   Troponin i, poc 0.00 0.00 - 0.08 ng/mL   Comment 3           Dg Chest  2 View  07/22/2015  CLINICAL DATA:  Left-sided chest wall pain. EXAM: CHEST  2 VIEW COMPARISON:  None. FINDINGS: Cardiomediastinal silhouette is normal. Mediastinal contours appear intact. There is no evidence of focal airspace consolidation, pleural effusion or pneumothorax. Osseous structures are without acute abnormality. Soft tissues are grossly normal. IMPRESSION: No active cardiopulmonary disease. Electronically Signed   By: Fidela Salisbury M.D.   On: 07/22/2015 19:49   Ct Angio Chest/abd/pel For Dissection W And/or W/wo  07/23/2015  CLINICAL DATA:  Acute onset of left-sided chest pain, radiating to the left shoulder, left axilla and left upper arm. Initial encounter. EXAM: CT ANGIOGRAPHY CHEST, ABDOMEN AND PELVIS TECHNIQUE: Multidetector CT imaging through the chest, abdomen and pelvis was performed using the standard protocol during bolus administration of intravenous contrast. Multiplanar reconstructed images and MIPs were obtained and reviewed to evaluate the vascular anatomy. CONTRAST:  100 mL of Isovue 370 IV contrast COMPARISON:  Chest radiograph from 07/22/2015 FINDINGS: CTA CHEST FINDINGS There is no evidence of aortic dissection. There is no evidence of aneurysmal dilatation. No calcific atherosclerotic disease is seen. The great vessels are grossly unremarkable in appearance. There is no  evidence of pulmonary embolus. The lungs are essentially clear bilaterally. There is no evidence of significant focal consolidation, pleural effusion or pneumothorax. No masses are identified; no abnormal focal contrast enhancement is seen. The mediastinum is unremarkable in appearance. No mediastinal lymphadenopathy is seen. No pericardial effusion is identified. The great vessels are grossly unremarkable in appearance. After No axillary lymphadenopathy is seen. The visualized portions of the thyroid gland are unremarkable in appearance. No acute osseous abnormalities are seen. Review of the MIP images  confirms the above findings. CTA ABDOMEN AND PELVIS FINDINGS There is no evidence of aortic dissection. There is no evidence of aneurysmal dilatation. No calcific atherosclerotic disease is seen. The celiac trunk, superior mesenteric artery, bilateral renal arteries and inferior mesenteric artery are grossly unremarkable in appearance. The inferior vena cava is grossly unremarkable in appearance. A 0.9 cm hypodensity is noted at the right hepatic lobe. The liver and spleen are otherwise unremarkable. The gallbladder is within normal limits. The pancreas and adrenal glands are unremarkable. The kidneys are unremarkable in appearance. There is no evidence of hydronephrosis. No renal or ureteral stones are seen. No perinephric stranding is appreciated. No free fluid is identified. The small bowel is unremarkable in appearance. The stomach is within normal limits. No acute vascular abnormalities are seen. The appendix is normal in caliber, without evidence of appendicitis. The colon is largely decompressed and is grossly unremarkable in appearance. The bladder is mildly distended and grossly unremarkable. The prostate remains normal in size, with minimal calcification. No inguinal lymphadenopathy is seen. No acute osseous abnormalities are identified. Review of the MIP images confirms the above findings. IMPRESSION: 1. No evidence of aortic dissection. No evidence of aneurysmal dilatation. No calcific atherosclerotic disease seen. 2. No evidence of pulmonary embolus. 3. Lungs clear bilaterally. 4. 0.9 cm hypodensity in the right hepatic lobe. This is nonspecific, but may reflect a small cyst. Electronically Signed   By: Garald Balding M.D.   On: 07/23/2015 02:02     Nonspecific Chest Pain  Chest pain can be caused by many different conditions. There is always a chance that your pain could be related to something serious, such as a heart attack or a blood clot in your lungs. Chest pain can also be caused by  conditions that are not life-threatening. If you have chest pain, it is very important to follow up with your health care provider. CAUSES  Chest pain can be caused by:  Heartburn.  Pneumonia or bronchitis.  Anxiety or stress.  Inflammation around your heart (pericarditis) or lung (pleuritis or pleurisy).  A blood clot in your lung.  A collapsed lung (pneumothorax). It can develop suddenly on its own (spontaneous pneumothorax) or from trauma to the chest.  Shingles infection (varicella-zoster virus).  Heart attack.  Damage to the bones, muscles, and cartilage that make up your chest wall. This can include:  Bruised bones due to injury.  Strained muscles or cartilage due to frequent or repeated coughing or overwork.  Fracture to one or more ribs.  Sore cartilage due to inflammation (costochondritis). RISK FACTORS  Risk factors for chest pain may include:  Activities that increase your risk for trauma or injury to your chest.  Respiratory infections or conditions that cause frequent coughing.  Medical conditions or overeating that can cause heartburn.  Heart disease or family history of heart disease.  Conditions or health behaviors that increase your risk of developing a blood clot.  Having had chicken pox (varicella zoster). SIGNS AND SYMPTOMS Chest  pain can feel like:  Burning or tingling on the surface of your chest or deep in your chest.  Crushing, pressure, aching, or squeezing pain.  Dull or sharp pain that is worse when you move, cough, or take a deep breath.  Pain that is also felt in your back, neck, shoulder, or arm, or pain that spreads to any of these areas. Your chest pain may come and go, or it may stay constant. DIAGNOSIS Lab tests or other studies may be needed to find the cause of your pain. Your health care provider may have you take a test called an ambulatory ECG (electrocardiogram). An ECG records your heartbeat patterns at the time the test  is performed. You may also have other tests, such as:  Transthoracic echocardiogram (TTE). During echocardiography, sound waves are used to create a picture of all of the heart structures and to look at how blood flows through your heart.  Transesophageal echocardiogram (TEE).This is a more advanced imaging test that obtains images from inside your body. It allows your health care provider to see your heart in finer detail.  Cardiac monitoring. This allows your health care provider to monitor your heart rate and rhythm in real time.  Holter monitor. This is a portable device that records your heartbeat and can help to diagnose abnormal heartbeats. It allows your health care provider to track your heart activity for several days, if needed.  Stress tests. These can be done through exercise or by taking medicine that makes your heart beat more quickly.  Blood tests.  Imaging tests. TREATMENT  Your treatment depends on what is causing your chest pain. Treatment may include:  Medicines. These may include:  Acid blockers for heartburn.  Anti-inflammatory medicine.  Pain medicine for inflammatory conditions.  Antibiotic medicine, if an infection is present.  Medicines to dissolve blood clots.  Medicines to treat coronary artery disease.  Supportive care for conditions that do not require medicines. This may include:  Resting.  Applying heat or cold packs to injured areas.  Limiting activities until pain decreases. HOME CARE INSTRUCTIONS  If you were prescribed an antibiotic medicine, finish it all even if you start to feel better.  Avoid any activities that bring on chest pain.  Do not use any tobacco products, including cigarettes, chewing tobacco, or electronic cigarettes. If you need help quitting, ask your health care provider.  Do not drink alcohol.  Take medicines only as directed by your health care provider.  Keep all follow-up visits as directed by your health  care provider. This is important. This includes any further testing if your chest pain does not go away.  If heartburn is the cause for your chest pain, you may be told to keep your head raised (elevated) while sleeping. This reduces the chance that acid will go from your stomach into your esophagus.  Make lifestyle changes as directed by your health care provider. These may include:  Getting regular exercise. Ask your health care provider to suggest some activities that are safe for you.  Eating a heart-healthy diet. A registered dietitian can help you to learn healthy eating options.  Maintaining a healthy weight.  Managing diabetes, if necessary.  Reducing stress. SEEK MEDICAL CARE IF:  Your chest pain does not go away after treatment.  You have a rash with blisters on your chest.  You have a fever. SEEK IMMEDIATE MEDICAL CARE IF:   Your chest pain is worse.  You have an increasing cough, or you  cough up blood.  You have severe abdominal pain.  You have severe weakness.  You faint.  You have chills.  You have sudden, unexplained chest discomfort.  You have sudden, unexplained discomfort in your arms, back, neck, or jaw.  You have shortness of breath at any time.  You suddenly start to sweat, or your skin gets clammy.  You feel nauseous or you vomit.  You suddenly feel light-headed or dizzy.  Your heart begins to beat quickly, or it feels like it is skipping beats. These symptoms may represent a serious problem that is an emergency. Do not wait to see if the symptoms will go away. Get medical help right away. Call your local emergency services (911 in the [place] Do not drive yourself to the hospital.   This information is not intended to replace advice given to you by your health care provider. Make sure you discuss any questions you have with your health care provider.   Document Released: 11/23/2004 Document Revised: 03/06/2014 Document Reviewed:  09/19/2013 Elsevier Interactive Patient Education [employer].

## 2015-08-14 ENCOUNTER — Ambulatory Visit (INDEPENDENT_AMBULATORY_CARE_PROVIDER_SITE_OTHER): Payer: 59 | Admitting: Physician Assistant

## 2015-08-14 VITALS — BP 124/80 | HR 92 | Temp 98.3°F | Ht 68.5 in | Wt 279.0 lb

## 2015-08-14 DIAGNOSIS — G43909 Migraine, unspecified, not intractable, without status migrainosus: Secondary | ICD-10-CM

## 2015-08-14 DIAGNOSIS — M5020 Other cervical disc displacement, unspecified cervical region: Secondary | ICD-10-CM

## 2015-08-14 DIAGNOSIS — Z6841 Body Mass Index (BMI) 40.0 and over, adult: Secondary | ICD-10-CM

## 2015-08-14 NOTE — Progress Notes (Signed)
Patient ID: Vincent Hernandez, male     DOB: 1966-01-27, 50 y.o.    MRN: [id number]  PCP: [facility], not seen there since 2013, so is considered a new patient, and unable to be seen until 10/2015.  Chief Complaint  Patient presents with  . surgical clearance    ACDF C/7-T1  . cervical disc    radiating down L/arm    Subjective:    HPI  Presents for pre-surgical clearance. This is his first visit at this office. History is obtained from the patient, his wife and the record.  Dr. Melina Schools will perform ACDF at C7-T1 due to herniated disc at that level, causing pain into the LEFT shoulder and arm that radiates all the way to the fingers.  ED visit 07/22/2015 with chest pain. EKG was normal. Troponins NEGATIVE x 2. CMET and CBC were normal. Cause determined to be due to the herniated cervical disc to be addressed with the upcoming surgery.  8 previous surgeries without anesthesia, cardiac, neurologic or respiratory complications. Family history of stroke at age 96 in his father. No family history of other cardiovascular event.  He suffers from migraine headaches.    Prior to Admission medications   Medication Sig Start Date End Date Taking? Authorizing Provider  aspirin 81 MG tablet Take 81 mg by mouth daily.   Yes Historical Provider, MD  aspirin-acetaminophen-caffeine (EXCEDRIN MIGRAINE) [phone] MG tablet Take 1 tablet by mouth every 6 (six) hours as needed for headache.   Yes Historical Provider, MD  B Complex-C (B-COMPLEX WITH VITAMIN C) tablet Take 1 tablet by mouth daily.   Yes Historical Provider, MD  CRANBERRY PO Take by mouth.   Yes Historical Provider, MD  cyclobenzaprine (FLEXERIL) 10 MG tablet Take 10 mg by mouth.   Yes Historical Provider, MD  fish oil-omega-3 fatty acids 1000 MG capsule Take 2 g by mouth daily.   Yes Historical Provider, MD  Glucosamine HCl (GLUCOSAMINE PO) Take 1 tablet by mouth daily.   Yes Historical Provider, MD    HYDROcodone-acetaminophen (NORCO/VICODIN) 5-325 MG tablet Take 1 tablet by mouth every 6 (six) hours as needed for moderate pain.   Yes Historical Provider, MD  Multiple Vitamin (MULTIVITAMIN WITH MINERALS) TABS tablet Take 1 tablet by mouth daily.   Yes Historical Provider, MD  pregabalin (LYRICA) 50 MG capsule Take 50 mg by mouth 2 (two) times daily.   Yes Historical Provider, MD  SUMAtriptan (IMITREX) 50 MG tablet Take 50 mg by mouth every 2 (two) hours as needed for migraine. May repeat in 2 hours if headache persists or recurs.   Yes Historical Provider, MD  oxyCODONE (ROXICODONE) 5 MG immediate release tablet Take 1-2 tablets (5-10 mg total) by mouth every 4 (four) hours as needed for pain. Patient not taking: Reported on 07/22/2015 11/21/12   Wylene Simmer, MD     Allergies  Allergen Reactions  . Robaxin [Methocarbamol] Itching     Patient Active Problem List   Diagnosis Date Noted  . Migraine headache 08/14/2015  . Herniated disc, cervical 08/14/2015  . BMI 40.0-44.9, adult ([place]) 08/14/2015     Family History  Problem Relation Age of Onset  . Cancer Father     lung     Social History   Social History  . Marital Status: Married    Spouse Name: Rodena Piety  . Number of Children: 3  . Years of Education: 13   Occupational History  . Maintenance Manager  [employer]   Social History Main Topics  . Smoking status: Former Research scientist (life sciences)  . Smokeless tobacco: Never Used     Comment: smoked for 2-3 years as an early adolescent  . Alcohol Use: 0.0 oz/week    0 Standard drinks or equivalent per week     Comment: once in a while-usually brings on migraines  . Drug Use: No  . Sexual Activity:    Partners: Female   Other Topics Concern  . Not on file   Social History Narrative   Lives with his wife and 3 dogs.   Adult children live in [place] and [place].        Review of Systems  Constitutional: Negative.   HENT: Negative for congestion, postnasal drip,  rhinorrhea, sinus pressure, sore throat, trouble swallowing and voice change.   Eyes: Positive for photophobia (with migraines) and visual disturbance (with migraine headache). Negative for pain, discharge, redness and itching.  Respiratory: Negative.   Cardiovascular: Negative.   Gastrointestinal: Positive for constipation (from pain medications). Negative for nausea, vomiting, abdominal pain, diarrhea, blood in stool, abdominal distention, anal bleeding and rectal pain.  Endocrine: Negative.   Genitourinary: Positive for frequency (a little bit more). Negative for dysuria, urgency and hematuria.  Musculoskeletal: Positive for back pain, arthralgias and neck pain. Negative for myalgias, joint swelling and neck stiffness.       Difficulty donning his socks in the morning  Skin: Negative.   Allergic/Immunologic: Negative.   Neurological: Positive for headaches (no new or different headaches for him). Negative for dizziness, speech difficulty, weakness and light-headedness.  Hematological: Negative.   Psychiatric/Behavioral: Negative.          Objective:  Physical Exam  Constitutional: He is oriented to person, place, and time. He appears well-developed and well-nourished. He is active and cooperative. No distress.  BP 124/80 mmHg  Pulse 92  Temp(Src) 98.3 F (36.8 C) (Oral)  Ht 5' 8.5" (1.74 m)  Wt 279 lb (126.554 kg)  BMI 41.80 kg/m2  SpO2 95%  HENT:  Head: Normocephalic and atraumatic.  Right Ear: Hearing normal.  Left Ear: Hearing normal.  Eyes: Conjunctivae are normal. No scleral icterus.  Neck: Normal range of motion. Neck supple. No thyromegaly present.  Cardiovascular: Normal rate, regular rhythm and normal heart sounds.   Pulses:      Radial pulses are 2+ on the right side, and 2+ on the left side.  Pulmonary/Chest: Effort normal and breath sounds normal.  Lymphadenopathy:       Head (right side): No tonsillar, no preauricular, no posterior auricular and no occipital  adenopathy present.       Head (left side): No tonsillar, no preauricular, no posterior auricular and no occipital adenopathy present.    He has no cervical adenopathy.       Right: No supraclavicular adenopathy present.       Left: No supraclavicular adenopathy present.  Neurological: He is alert and oriented to person, place, and time. No sensory deficit.  Skin: Skin is warm, dry and intact. No rash noted. No cyanosis or erythema. Nails show no clubbing.  Psychiatric: He has a normal mood and affect. His speech is normal and behavior is normal.             Assessment & Plan:  1. Migraine without status migrainosus, not intractable, unspecified migraine type 2. Herniated disc, cervical 3. BMI 40.0-44.9, adult [facility])  I believe that he is a relatively low risk for surgery.  EKG and labs  in the ED 3 weeks ago reviewed with him. There is no lipid profile. Form signed to proceed with surgery from a medical standpoint. The patient's wife will return the form to the surgeon's office, as she works there. Encouraged the patient to follow-up with his PCP in 10/2015 to update remaining outstanding health maintenance items.   Fara Chute, PA-C Physician Assistant-Certified [facility]

## 2015-08-14 NOTE — Patient Instructions (Signed)
     IF you received an x-ray today, you will receive an invoice from [facility]. Please contact [facility] at [phone] with questions or concerns regarding your invoice.   IF you received labwork today, you will receive an invoice from [facility]. Please contact [facility] at [phone] with questions or concerns regarding your invoice.   Our billing staff will not be able to assist you with questions regarding bills from these companies.  You will be contacted with the lab results as soon as they are available. The fastest way to get your results is to activate your My Chart account. Instructions are located on the last page of this paperwork. If you have not heard from us regarding the results in 2 weeks, please contact this office.      

## 2015-08-16 ENCOUNTER — Ambulatory Visit: Payer: Self-pay | Admitting: Physician Assistant

## 2015-08-27 ENCOUNTER — Encounter (HOSPITAL_COMMUNITY)
Admission: RE | Admit: 2015-08-27 | Discharge: 2015-08-27 | Disposition: A | Discharge status: Home / Self Care | Payer: 59 | Source: Ambulatory Visit | Attending: Orthopedic Surgery | Admitting: Orthopedic Surgery

## 2015-08-27 ENCOUNTER — Encounter (HOSPITAL_COMMUNITY): Payer: Self-pay

## 2015-08-27 DIAGNOSIS — Z7982 Long term (current) use of aspirin: Secondary | ICD-10-CM | POA: Insufficient documentation

## 2015-08-27 DIAGNOSIS — Z79899 Other long term (current) drug therapy: Secondary | ICD-10-CM | POA: Diagnosis not present

## 2015-08-27 DIAGNOSIS — K219 Gastro-esophageal reflux disease without esophagitis: Secondary | ICD-10-CM | POA: Insufficient documentation

## 2015-08-27 DIAGNOSIS — Z01812 Encounter for preprocedural laboratory examination: Secondary | ICD-10-CM | POA: Insufficient documentation

## 2015-08-27 DIAGNOSIS — M5023 Other cervical disc displacement, cervicothoracic region: Secondary | ICD-10-CM | POA: Diagnosis not present

## 2015-08-27 DIAGNOSIS — Z01818 Encounter for other preprocedural examination: Secondary | ICD-10-CM | POA: Insufficient documentation

## 2015-08-27 DIAGNOSIS — Z87891 Personal history of nicotine dependence: Secondary | ICD-10-CM | POA: Insufficient documentation

## 2015-08-27 HISTORY — DX: Unspecified osteoarthritis, unspecified site: M19.90

## 2015-08-27 HISTORY — DX: Gastro-esophageal reflux disease without esophagitis: K21.9

## 2015-08-27 HISTORY — DX: Other cervical disc displacement, unspecified cervical region: M50.20

## 2015-08-27 LAB — CBC
HCT: 42.9 % (ref 39.0–52.0)
Hemoglobin: 15.2 g/dL (ref 13.0–17.0)
MCH: 32.5 pg (ref 26.0–34.0)
MCHC: 35.4 g/dL (ref 30.0–36.0)
MCV: 91.9 fL (ref 78.0–100.0)
PLATELETS: 315 10*3/uL (ref 150–400)
RBC: 4.67 MIL/uL (ref 4.22–5.81)
RDW: 12.9 % (ref 11.5–15.5)
WBC: 10.1 10*3/uL (ref 4.0–10.5)

## 2015-08-27 LAB — SURGICAL PCR SCREEN
MRSA, PCR: NEGATIVE
STAPHYLOCOCCUS AUREUS: NEGATIVE

## 2015-08-27 NOTE — Pre-Procedure Instructions (Signed)
    Vincent Hernandez  08/27/2015      [facility] [name], [place] [address] Phone: [phone] Fax: [phone]    Your procedure is scheduled on Wednesday, September 01, 2015  Report to [facility] Admitting at 10:40 A.M.  Call this number if you have problems the morning of surgery:  [phone]   Remember:  Do not eat food or drink liquids after midnight Tuesday, August 31, 2015  Take these medicines the morning of surgery with A SIP OF WATER : pregabalin (LYRICA), if needed: SUMAtriptan (IMITREX) for migraine,  HYDROcodone-acetaminophen (NORCO/VICODIN) for pain Stop taking Aspirin, vitamins, fish oil and herbal medications such as CRANBERRY and Glucosamine . Do not take any NSAIDs ie: Ibuprofen, Advil, Naproxen, BC and Goody Powder or any medication containing Aspirin such as Excedrin Migraine; stop now.  Do not wear jewelry, make-up or nail polish.  Do not wear lotions, powders, or perfumes.  You may not wear deoderant.  Do not shave 48 hours prior to surgery.  Men may shave face and neck.  Do not bring valuables to the hospital.  [facility] is not responsible for any belongings or valuables.  Contacts, dentures or bridgework may not be worn into surgery.  Leave your suitcase in the car.  After surgery it may be brought to your room.  For patients admitted to the hospital, discharge time will be determined by your treatment team.  Patients discharged the day of surgery will not be allowed to drive home.   Name and phone number of your driver:   Special instructions:   Please read over the following fact sheets that you were given. Pain Booklet, Coughing and Deep Breathing, and Surgical Site Infection Prevention

## 2015-08-27 NOTE — Progress Notes (Signed)
Pt denies SOB, chest pain, and being under the care of a cardiologist. Pt denies having a stress test, echo and cardiac cath. Pt denies having an EKG and chest x ray within the last year. Pt denies having labs drawn within the last 2 weeks. Pt PCP is Harrison Mons, PA. Pt chart forwarded to anesthesia for Consult and to review clearance note in Epic.

## 2015-08-30 MED ORDER — DEXTROSE 5 % IV SOLN
3.0000 g | INTRAVENOUS | Status: AC
  Administered 2015-09-01: 3 g via INTRAVENOUS
  Filled 2015-08-30: qty 3000

## 2015-08-30 NOTE — Progress Notes (Signed)
Anesthesia Chart Review: 50 year old male for C7-T1 ACDF on 09/01/15 by Dr. Rolena Infante.  HX includes former smoker, GERD, migraines, septoplasty, herniated cervical disc, neuromuscular disorder (not specified). ED visit 07/22/15 for left shoulder pain with radiation chest and to fingers. EKG unremarkable. Troponins negative. CTA chest negative for PE or dissection. PCP follow-up recommended and notes indicate symptoms felt related to herniated cervical disc  BMI is consistent with morbid obesity.  Patient was seen by Harrison Mons, PA-C on 08/14/15 for pre-operative surgical clearance. She felt he was "relatively low risk for surgery."  Meds include ASA 81mg , Flexeril, fish oil, Lyrica, Imitrex, Norco.  07/22/15 EKG: SR, consider LAE. Patient denied CP and SOB at PAT.  07/22/15 CXR: IMPRESSION: No active cardiopulmonary disease.  07/23/15 CTA Chest: IMPRESSION: 1. No evidence of aortic dissection. No evidence of aneurysmal dilatation. No calcific atherosclerotic disease seen. 2. No evidence of pulmonary embolus. 3. Lungs clear bilaterally. 4. 0.9 cm hypodensity in the right hepatic lobe. This is nonspecific, but may reflect a small cyst.  Preoperative CBC noted.  Patient with recent medical evaluation and clearance for surgery. If no acute changes then I would anticipate that he could proceed as planned.  [facility] Short Stay Center/Anesthesiology Phone [phone] 08/30/2015 10:49 AM

## 2015-09-01 ENCOUNTER — Encounter (HOSPITAL_COMMUNITY)
Admission: RE | Disposition: A | Discharge status: Home / Self Care | Payer: Self-pay | Source: Ambulatory Visit | Attending: Orthopedic Surgery

## 2015-09-01 ENCOUNTER — Observation Stay (HOSPITAL_COMMUNITY): Payer: 59

## 2015-09-01 ENCOUNTER — Ambulatory Visit (HOSPITAL_COMMUNITY): Payer: 59 | Admitting: Vascular Surgery

## 2015-09-01 ENCOUNTER — Encounter (HOSPITAL_COMMUNITY): Payer: Self-pay | Admitting: *Deleted

## 2015-09-01 ENCOUNTER — Observation Stay (HOSPITAL_COMMUNITY)
Admission: RE | Admit: 2015-09-01 | Discharge: 2015-09-02 | Disposition: A | Discharge status: Home / Self Care | Payer: 59 | Source: Ambulatory Visit | Attending: Orthopedic Surgery | Admitting: Orthopedic Surgery

## 2015-09-01 ENCOUNTER — Ambulatory Visit (HOSPITAL_COMMUNITY): Payer: 59 | Admitting: Certified Registered"

## 2015-09-01 ENCOUNTER — Ambulatory Visit (HOSPITAL_COMMUNITY): Payer: 59

## 2015-09-01 DIAGNOSIS — K219 Gastro-esophageal reflux disease without esophagitis: Secondary | ICD-10-CM | POA: Insufficient documentation

## 2015-09-01 DIAGNOSIS — Z419 Encounter for procedure for purposes other than remedying health state, unspecified: Secondary | ICD-10-CM

## 2015-09-01 DIAGNOSIS — M542 Cervicalgia: Secondary | ICD-10-CM | POA: Diagnosis present

## 2015-09-01 DIAGNOSIS — M5013 Cervical disc disorder with radiculopathy, cervicothoracic region: Secondary | ICD-10-CM | POA: Diagnosis not present

## 2015-09-01 DIAGNOSIS — R262 Difficulty in walking, not elsewhere classified: Secondary | ICD-10-CM | POA: Insufficient documentation

## 2015-09-01 DIAGNOSIS — M5023 Other cervical disc displacement, cervicothoracic region: Secondary | ICD-10-CM | POA: Diagnosis present

## 2015-09-01 DIAGNOSIS — Z981 Arthrodesis status: Secondary | ICD-10-CM

## 2015-09-01 HISTORY — PX: ANTERIOR CERVICAL DECOMP/DISCECTOMY FUSION: SHX1161

## 2015-09-01 SURGERY — ANTERIOR CERVICAL DECOMPRESSION/DISCECTOMY FUSION 1 LEVEL
Anesthesia: General

## 2015-09-01 MED ORDER — ROCURONIUM BROMIDE 50 MG/5ML IV SOLN
INTRAVENOUS | Status: AC
  Filled 2015-09-01: qty 1

## 2015-09-01 MED ORDER — LACTATED RINGERS IV SOLN
INTRAVENOUS | Status: DC
  Administered 2015-09-01 (×2): via INTRAVENOUS

## 2015-09-01 MED ORDER — ONDANSETRON HCL 4 MG/2ML IJ SOLN
INTRAMUSCULAR | Status: AC
  Filled 2015-09-01: qty 2

## 2015-09-01 MED ORDER — ACETAMINOPHEN 10 MG/ML IV SOLN
INTRAVENOUS | Status: AC
  Filled 2015-09-01: qty 100

## 2015-09-01 MED ORDER — LIDOCAINE HCL (CARDIAC) 20 MG/ML IV SOLN
INTRAVENOUS | Status: DC | PRN
  Administered 2015-09-01: 100 mg via INTRAVENOUS

## 2015-09-01 MED ORDER — MIDAZOLAM HCL 5 MG/5ML IJ SOLN
INTRAMUSCULAR | Status: DC | PRN
  Administered 2015-09-01: 2 mg via INTRAVENOUS

## 2015-09-01 MED ORDER — THROMBIN 20000 UNITS EX SOLR
CUTANEOUS | Status: AC
  Filled 2015-09-01: qty 20000

## 2015-09-01 MED ORDER — EPHEDRINE 5 MG/ML INJ
INTRAVENOUS | Status: AC
  Filled 2015-09-01: qty 10

## 2015-09-01 MED ORDER — FENTANYL CITRATE (PF) 250 MCG/5ML IJ SOLN
INTRAMUSCULAR | Status: AC
  Filled 2015-09-01: qty 5

## 2015-09-01 MED ORDER — BUPIVACAINE-EPINEPHRINE 0.25% -1:200000 IJ SOLN
INTRAMUSCULAR | Status: DC | PRN
  Administered 2015-09-01: 10 mL

## 2015-09-01 MED ORDER — ONDANSETRON HCL 4 MG/2ML IJ SOLN: 4.0000 mg | INTRAMUSCULAR | Status: DC | PRN

## 2015-09-01 MED ORDER — OXYCODONE HCL 5 MG PO TABS: 10.0000 mg | ORAL_TABLET | ORAL | Status: DC | PRN

## 2015-09-01 MED ORDER — DEXAMETHASONE SODIUM PHOSPHATE 10 MG/ML IJ SOLN
INTRAMUSCULAR | Status: AC
  Filled 2015-09-01: qty 2

## 2015-09-01 MED ORDER — SUCCINYLCHOLINE CHLORIDE 20 MG/ML IJ SOLN
INTRAMUSCULAR | Status: DC | PRN
  Administered 2015-09-01: 120 mg via INTRAVENOUS

## 2015-09-01 MED ORDER — ONDANSETRON HCL 4 MG PO TABS: 4.0000 mg | ORAL_TABLET | Freq: Three times a day (TID) | ORAL | Status: DC | PRN

## 2015-09-01 MED ORDER — HYDROMORPHONE HCL 1 MG/ML IJ SOLN: 0.2500 mg | INTRAMUSCULAR | Status: DC | PRN

## 2015-09-01 MED ORDER — 0.9 % SODIUM CHLORIDE (POUR BTL) OPTIME
TOPICAL | Status: DC | PRN
  Administered 2015-09-01 (×2): 1000 mL

## 2015-09-01 MED ORDER — SODIUM CHLORIDE 0.9% FLUSH
3.0000 mL | Freq: Two times a day (BID) | INTRAVENOUS | Status: DC
  Administered 2015-09-01: 3 mL via INTRAVENOUS

## 2015-09-01 MED ORDER — PHENYLEPHRINE HCL 10 MG/ML IJ SOLN
INTRAMUSCULAR | Status: DC | PRN
  Administered 2015-09-01 (×2): 200 ug via INTRAVENOUS
  Administered 2015-09-01 (×2): 160 ug via INTRAVENOUS
  Administered 2015-09-01: 80 ug via INTRAVENOUS

## 2015-09-01 MED ORDER — PHENYLEPHRINE HCL 10 MG/ML IJ SOLN: 10.0000 mg | INTRAMUSCULAR | Status: DC | PRN

## 2015-09-01 MED ORDER — MORPHINE SULFATE (PF) 2 MG/ML IV SOLN: 1.0000 mg | INTRAVENOUS | Status: DC | PRN

## 2015-09-01 MED ORDER — DEXAMETHASONE 4 MG PO TABS
4.0000 mg | ORAL_TABLET | Freq: Four times a day (QID) | ORAL | Status: DC
  Administered 2015-09-01 – 2015-09-02 (×3): 4 mg via ORAL
  Filled 2015-09-01 (×3): qty 1

## 2015-09-01 MED ORDER — SODIUM CHLORIDE 0.9 % IV SOLN: 250.0000 mL | INTRAVENOUS | Status: DC

## 2015-09-01 MED ORDER — LIDOCAINE 2% (20 MG/ML) 5 ML SYRINGE
INTRAMUSCULAR | Status: AC
  Filled 2015-09-01: qty 5

## 2015-09-01 MED ORDER — GLYCOPYRROLATE 0.2 MG/ML IJ SOLN
INTRAMUSCULAR | Status: DC | PRN
  Administered 2015-09-01: 0.2 mg via INTRAVENOUS

## 2015-09-01 MED ORDER — PROMETHAZINE HCL 25 MG/ML IJ SOLN: 6.2500 mg | INTRAMUSCULAR | Status: DC | PRN

## 2015-09-01 MED ORDER — DEXAMETHASONE SODIUM PHOSPHATE 4 MG/ML IJ SOLN: 4.0000 mg | Freq: Four times a day (QID) | INTRAMUSCULAR | Status: DC

## 2015-09-01 MED ORDER — ACETAMINOPHEN 10 MG/ML IV SOLN
INTRAVENOUS | Status: DC | PRN
  Administered 2015-09-01: 1000 mg via INTRAVENOUS

## 2015-09-01 MED ORDER — LACTATED RINGERS IV SOLN: INTRAVENOUS | Status: DC

## 2015-09-01 MED ORDER — BUPIVACAINE-EPINEPHRINE (PF) 0.25% -1:200000 IJ SOLN
INTRAMUSCULAR | Status: AC
  Filled 2015-09-01: qty 30

## 2015-09-01 MED ORDER — ROCURONIUM BROMIDE 100 MG/10ML IV SOLN
INTRAVENOUS | Status: DC | PRN
  Administered 2015-09-01: 20 mg via INTRAVENOUS
  Administered 2015-09-01 (×2): 10 mg via INTRAVENOUS
  Administered 2015-09-01: 50 mg via INTRAVENOUS

## 2015-09-01 MED ORDER — SUGAMMADEX SODIUM 500 MG/5ML IV SOLN
INTRAVENOUS | Status: AC
  Filled 2015-09-01: qty 5

## 2015-09-01 MED ORDER — SODIUM CHLORIDE 0.9 % IV SOLN
20.0000 mg | INTRAVENOUS | Status: DC | PRN
  Administered 2015-09-01: 50 ug/min via INTRAVENOUS

## 2015-09-01 MED ORDER — MIDAZOLAM HCL 2 MG/2ML IJ SOLN
INTRAMUSCULAR | Status: AC
  Filled 2015-09-01: qty 2

## 2015-09-01 MED ORDER — ONDANSETRON HCL 4 MG/2ML IJ SOLN
INTRAMUSCULAR | Status: DC | PRN
  Administered 2015-09-01: 4 mg via INTRAVENOUS

## 2015-09-01 MED ORDER — THROMBIN 20000 UNITS EX SOLR
CUTANEOUS | Status: DC | PRN
  Administered 2015-09-01: 20 mL via TOPICAL

## 2015-09-01 MED ORDER — OXYCODONE-ACETAMINOPHEN 10-325 MG PO TABS: 1.0000 | ORAL_TABLET | ORAL | Status: DC | PRN

## 2015-09-01 MED ORDER — PHENYLEPHRINE HCL 10 MG/ML IJ SOLN
INTRAMUSCULAR | Status: AC
  Filled 2015-09-01: qty 1

## 2015-09-01 MED ORDER — SUCCINYLCHOLINE CHLORIDE 200 MG/10ML IV SOSY
PREFILLED_SYRINGE | INTRAVENOUS | Status: AC
  Filled 2015-09-01: qty 10

## 2015-09-01 MED ORDER — HEMOSTATIC AGENTS (NO CHARGE) OPTIME
TOPICAL | Status: DC | PRN
  Administered 2015-09-01: 1 via TOPICAL

## 2015-09-01 MED ORDER — PROPOFOL 10 MG/ML IV BOLUS
INTRAVENOUS | Status: DC | PRN
  Administered 2015-09-01: 200 mg via INTRAVENOUS

## 2015-09-01 MED ORDER — DEXAMETHASONE SODIUM PHOSPHATE 10 MG/ML IJ SOLN
INTRAMUSCULAR | Status: DC | PRN
  Administered 2015-09-01: 10 mg via INTRAVENOUS

## 2015-09-01 MED ORDER — PHENYLEPHRINE 40 MCG/ML (10ML) SYRINGE FOR IV PUSH (FOR BLOOD PRESSURE SUPPORT)
PREFILLED_SYRINGE | INTRAVENOUS | Status: AC
  Filled 2015-09-01: qty 10

## 2015-09-01 MED ORDER — CYCLOBENZAPRINE HCL 10 MG PO TABS: 10.0000 mg | ORAL_TABLET | Freq: Three times a day (TID) | ORAL | Status: DC | PRN

## 2015-09-01 MED ORDER — PHENYLEPHRINE HCL 10 MG/ML IJ SOLN
INTRAMUSCULAR | Status: AC
  Filled 2015-09-01: qty 2

## 2015-09-01 MED ORDER — SUGAMMADEX SODIUM 500 MG/5ML IV SOLN
INTRAVENOUS | Status: DC | PRN
  Administered 2015-09-01: 252.2 mg via INTRAVENOUS

## 2015-09-01 MED ORDER — SODIUM CHLORIDE 0.9% FLUSH: 3.0000 mL | INTRAVENOUS | Status: DC | PRN

## 2015-09-01 MED ORDER — MENTHOL 3 MG MT LOZG
1.0000 | LOZENGE | OROMUCOSAL | Status: DC | PRN
  Filled 2015-09-01: qty 9

## 2015-09-01 MED ORDER — CEFAZOLIN IN D5W 1 GM/50ML IV SOLN
1.0000 g | Freq: Three times a day (TID) | INTRAVENOUS | Status: AC
  Administered 2015-09-01 – 2015-09-02 (×2): 1 g via INTRAVENOUS
  Filled 2015-09-01 (×2): qty 50

## 2015-09-01 MED ORDER — FENTANYL CITRATE (PF) 100 MCG/2ML IJ SOLN
INTRAMUSCULAR | Status: DC | PRN
  Administered 2015-09-01: 50 ug via INTRAVENOUS
  Administered 2015-09-01: 100 ug via INTRAVENOUS

## 2015-09-01 MED ORDER — PHENOL 1.4 % MT LIQD
1.0000 | OROMUCOSAL | Status: DC | PRN
  Administered 2015-09-01: 1 via OROMUCOSAL
  Filled 2015-09-01: qty 177

## 2015-09-01 MED ORDER — PHENYLEPHRINE 40 MCG/ML (10ML) SYRINGE FOR IV PUSH (FOR BLOOD PRESSURE SUPPORT)
PREFILLED_SYRINGE | INTRAVENOUS | Status: AC
  Filled 2015-09-01: qty 20

## 2015-09-01 SURGICAL SUPPLY — 56 items
BLADE SURG ROTATE 9660 (MISCELLANEOUS) IMPLANT
CANISTER SUCTION 2500CC (MISCELLANEOUS) ×2 IMPLANT
CLSR STERI-STRIP ANTIMIC 1/2X4 (GAUZE/BANDAGES/DRESSINGS) ×2 IMPLANT
CORDS BIPOLAR (ELECTRODE) ×2 IMPLANT
COVER SURGICAL LIGHT HANDLE (MISCELLANEOUS) ×4 IMPLANT
CRADLE DONUT ADULT HEAD (MISCELLANEOUS) ×2 IMPLANT
DEVICE FUSION NANLCK 8MM 6DEG (Neuro Prosthesis/Implant) ×1 IMPLANT
DRAPE C-ARM 42X72 X-RAY (DRAPES) ×2 IMPLANT
DRAPE POUCH INSTRU U-SHP 10X18 (DRAPES) ×2 IMPLANT
DRAPE SURG 17X23 STRL (DRAPES) ×4 IMPLANT
DRAPE U-SHAPE 47X51 STRL (DRAPES) ×2 IMPLANT
DRSG AQUACEL AG ADV 3.5X 4 (GAUZE/BANDAGES/DRESSINGS) ×2 IMPLANT
DURAPREP 6ML APPLICATOR 50/CS (WOUND CARE) ×2 IMPLANT
ELECT COATED BLADE 2.86 ST (ELECTRODE) ×2 IMPLANT
ELECT PENCIL ROCKER SW 15FT (MISCELLANEOUS) ×2 IMPLANT
ELECT REM PT RETURN 9FT ADLT (ELECTROSURGICAL) ×2
ELECTRODE REM PT RTRN 9FT ADLT (ELECTROSURGICAL) ×1 IMPLANT
FUSION TCS NANOLOCK 8MM 6DEG (Neuro Prosthesis/Implant) ×2 IMPLANT
GLOVE BIO SURGEON STRL SZ 6.5 (GLOVE) ×2 IMPLANT
GLOVE BIOGEL PI IND STRL 6.5 (GLOVE) ×2 IMPLANT
GLOVE BIOGEL PI IND STRL 8.5 (GLOVE) ×1 IMPLANT
GLOVE BIOGEL PI INDICATOR 6.5 (GLOVE) ×2
GLOVE BIOGEL PI INDICATOR 8.5 (GLOVE) ×1
GLOVE SS BIOGEL STRL SZ 8.5 (GLOVE) ×1 IMPLANT
GLOVE SUPERSENSE BIOGEL SZ 8.5 (GLOVE) ×1
GLOVE SURG SS PI 6.5 STRL IVOR (GLOVE) ×4 IMPLANT
GOWN STRL REUS W/ TWL LRG LVL3 (GOWN DISPOSABLE) ×2 IMPLANT
GOWN STRL REUS W/TWL 2XL LVL3 (GOWN DISPOSABLE) ×4 IMPLANT
GOWN STRL REUS W/TWL LRG LVL3 (GOWN DISPOSABLE) ×2
KIT BASIN OR (CUSTOM PROCEDURE TRAY) ×2 IMPLANT
KIT ROOM TURNOVER OR (KITS) ×2 IMPLANT
NEEDLE SPNL 18GX3.5 QUINCKE PK (NEEDLE) ×2 IMPLANT
NS IRRIG 1000ML POUR BTL (IV SOLUTION) ×2 IMPLANT
PACK ORTHO CERVICAL (CUSTOM PROCEDURE TRAY) ×2 IMPLANT
PACK UNIVERSAL I (CUSTOM PROCEDURE TRAY) ×2 IMPLANT
PAD ARMBOARD 7.5X6 YLW CONV (MISCELLANEOUS) ×4 IMPLANT
PATTIES SURGICAL .25X.25 (GAUZE/BANDAGES/DRESSINGS) IMPLANT
PIN DISTRACTION 14 (PIN) ×4 IMPLANT
PUTTY BONE DBX 2.5 MIS (Bone Implant) ×2 IMPLANT
RESTRAINT LIMB HOLDER UNIV (RESTRAINTS) ×2 IMPLANT
SCREW 3.8X16MM (Screw) ×2 IMPLANT
SCREW ENDO BONE 3.8X14MM (Screw) ×2 IMPLANT
SPONGE INTESTINAL PEANUT (DISPOSABLE) ×2 IMPLANT
SPONGE SURGIFOAM ABS GEL 100 (HEMOSTASIS) ×2 IMPLANT
SURGIFLO W/THROMBIN 8M KIT (HEMOSTASIS) IMPLANT
SUT BONE WAX W31G (SUTURE) ×2 IMPLANT
SUT MON AB 3-0 SH 27 (SUTURE) ×1
SUT MON AB 3-0 SH27 (SUTURE) ×1 IMPLANT
SUT VIC AB 2-0 CT1 18 (SUTURE) ×2 IMPLANT
SYR BULB IRRIGATION 50ML (SYRINGE) ×2 IMPLANT
SYR CONTROL 10ML LL (SYRINGE) ×2 IMPLANT
TAPE CLOTH 4X10 WHT NS (GAUZE/BANDAGES/DRESSINGS) ×2 IMPLANT
TAPE UMBILICAL COTTON 1/8X30 (MISCELLANEOUS) ×2 IMPLANT
TOWEL OR 17X24 6PK STRL BLUE (TOWEL DISPOSABLE) ×2 IMPLANT
TOWEL OR 17X26 10 PK STRL BLUE (TOWEL DISPOSABLE) ×2 IMPLANT
WATER STERILE IRR 1000ML POUR (IV SOLUTION) ×2 IMPLANT

## 2015-09-01 NOTE — Transfer of Care (Signed)
Immediate Anesthesia Transfer of Care Note  Patient: Vincent Hernandez  Procedure(s) Performed: Procedure(s): ACDF C7-T1 (N/A)  Patient Location: PACU  Anesthesia Type:General  Level of Consciousness: awake  Airway & Oxygen Therapy: Patient Spontanous Breathing  Post-op Assessment: Report given to RN and Post -op Vital signs reviewed and stable  Post vital signs: Reviewed and stable  Last Vitals:  Filed Vitals:   09/01/15 1106 09/01/15 1640  BP: 137/77 115/64  Pulse: 90 96  Temp: 37.1 C 37.1 C  Resp: 20 20    Last Pain:  Filed Vitals:   09/01/15 1708  PainSc: 3       Patients Stated Pain Goal: 4 ([id number])  Complications: No apparent anesthesia complications

## 2015-09-01 NOTE — Op Note (Signed)
NAME:  KAILLOU, CUPPETT               ACCOUNT NO.:  [id number]  MEDICAL RECORD NO.:  [id number]  LOCATION:  3C07C                        FACILITY:  [facility]  PHYSICIAN:  Leyan Branden D. Rolena Infante, M.D. DATE OF BIRTH:  [birth year]  DATE OF PROCEDURE:  09/01/2015 DATE OF DISCHARGE:                              OPERATIVE REPORT   PREOPERATIVE DIAGNOSIS:  Cervical disc herniation, C7-T1 with C8 radiculopathy, left side.  POSTOPERATIVE DIAGNOSIS:  Cervical disc herniation, C7-T1 with C8 radiculopathy, left side.  OPERATIVE PROCEDURE:  Anterior cervical diskectomy and fusion, C7-T1.  COMPLICATIONS:  None.  CONDITION:  Stable.  FIRST ASSISTANT:  [employer].  IMPLANT USED:  Titanium nanoLOCK 0 profile interbody device with 16 mm locking screw going into the body of C7 and a 14 mm locking screw into the body of T1, utilized DBX mix.  HISTORY:  This is a very pleasant, 50 year old gentleman, who presents with severe C8 radiculopathy on the left side.  He had numbness and dysesthesias in the C8 distribution with grip strength weakness.  After MRI imaging demonstrated a large posterior lateral to the left disk herniation at C7-T1.  After discussing treatment options, we elected to proceed with surgery.  All appropriate risks, benefits, and alternatives were explained to the patient and consent was obtained.  OPERATIVE NOTE:  The patient was brought to the operating room, placed supine on the operating table.  After successful induction of general anesthesia and endotracheal intubation, TEDs and SCDs were applied, and the anterior cervical spine was prepped and draped in a standard fashion.  Time-out was taken confirming patient, procedure, and all other pertinent important data.  Using an oblique view, I was able to identify the C7-T1 level and I drew this out and infiltrated the incision site with 0.25% Marcaine.  A transverse incision starting at the midline proceeding to the left was made.   Sharp dissection was carried out down to the platysma.  Self-retaining retractor was placed. The platysma was transected.  I then identified the sternocleidomastoid and began dissecting along the medial border.  I continued into the deep cervical fascia.  I eventually identified the omohyoid, released this from its fascial sling and transected this for better visualization. This allowed me to identify the esophagus and trachea.  I bluntly dissected through the remaining deep cervical and prevertebral fascia. I palpated the carotid pulsation laterally with a finger, and then swept the esophagus to the right with another finger.  I then placed a self- retaining retractor.  I then used Kittner dissectors to remove the remaining prevertebral fascia and expose the ALL.  An x-ray identified the C6-7 level in the lateral plane and then shifted down 1 disc space level and repeated the x-ray.  Using traction and depressing the shoulders, I was able to visualize the C6-7 disk space and the inferior aspect of the C7 vertebral body, and confirmed I was at the appropriate level.  At this point, I mobilized the longus colli muscle to expose from the midbody of C7 to the midbody of T1.  Caspar self-retaining tractors were placed underneath the longus colli muscle.  The endotracheal cuff was deflated.  I expanded the retractor and reinflated the cuff.  An annulotomy was performed with a 15 blade scalpel, and then using pituitary rongeurs, I removed the bulk of the disk material.  I then placed my distraction pins into the bodies of C7 and T1 and distracted the intervertebral space.  Using Neuro curettes, I continued dissecting and removing the disc so as to expose the posterior annulus.  I then used a 1 mm Kerrison to trim down the posterior osteophyte from the C7 and T1 vertebral bodies.  At this point, using a fine nerve hook, I was able to deliver multiple fragments of disk material from the  left posterior lateral corner.  This was very consistent with the preoperative MRI.  I then developed a plane underneath the posterior longitudinal ligament and resected the PLL.  This allowed me to remove a few more fragments of disk material from the posterior lateral gutter. At this point, I visualized the thecal sac expanding into the space that had been created by the disk herniation.  I could freely pass my neural hook out underneath the uncovertebral joint into the foramen and circumferentially where the disk herniation was located.  At this point, I was very pleased with the diskectomy.  I rasped the endplates and then measured and trialed with intervertebral devices.  I elected to use the size 8 medium Titan intervertebral 0 profile nanoLOCK cage.  This was obtained, packed with DBX mix, and malleted to the appropriate depth. An awl was used to brace through the cortex, and then I placed a 14 mm screw down through the cage and into the T1 vertebral body and a 16 into the C7 vertebral body.  Both screws were hand torqued down according to manufactures' standards.  Distraction pins were removed as was the self- retaining retractor.  The bone holes were sealed with bone wax.  I then irrigated this wound copiously with normal saline.  I confirmed the esophagus was not traumatized during the procedure.  I returned it to the midline and made sure hemostasis and closed the platysma with interrupted 2-0 Vicryl sutures and the skin with 3-0 Monocryl.  Steri- Strips and dry a dressing were applied.  The patient was ultimately extubated, transferred to PACU without incident.  At the end of the case, all needle and sponge counts were correct.  There were no adverse intraoperative events.  First assistant was [employer], my PA.     Deeksha Cotrell D. Rolena Infante, M.D.     DDB/MEDQ  D:  09/01/2015  T:  09/01/2015  Job:  [id number]

## 2015-09-01 NOTE — Anesthesia Procedure Notes (Signed)
Procedure Name: Intubation Date/Time: 09/01/2015 2:24 PM Performed by: Freddie Breech Pre-anesthesia Checklist: Patient identified, Emergency Drugs available, Suction available, Patient being monitored and Timeout performed Patient Re-evaluated:Patient Re-evaluated prior to inductionOxygen Delivery Method: Circle system utilized Preoxygenation: Pre-oxygenation with 100% oxygen Intubation Type: IV induction and Rapid sequence Laryngoscope Size: Glidescope and 4 Grade View: Grade I Tube size: 7.5 mm Number of attempts: 1 Airway Equipment and Method: Patient positioned with wedge pillow and Stylet Placement Confirmation: positive ETCO2,  CO2 detector and breath sounds checked- equal and bilateral (ETT placed with glidescope. ) Secured at: 23 cm Tube secured with: Tape Dental Injury: Teeth and Oropharynx as per pre-operative assessment

## 2015-09-01 NOTE — Brief Op Note (Signed)
09/01/2015  4:31 PM  PATIENT:  Vincent Hernandez  50 y.o. male  PRE-OPERATIVE DIAGNOSIS:  C7-T1 HNP  POST-OPERATIVE DIAGNOSIS:  C7-T1 HNP  PROCEDURE:  Procedure(s): ACDF C7-T1 (N/A)  SURGEON:  Surgeon(s) and Role:    * Melina Schools, MD - Primary  PHYSICIAN ASSISTANT:   ASSISTANTS: Carmen Mayo   ANESTHESIA:   general  EBL:  Total I/O In: 1000 [I.V.:1000] Out: -   BLOOD ADMINISTERED:none  DRAINS: none   LOCAL MEDICATIONS USED:  MARCAINE     SPECIMEN:  No Specimen  DISPOSITION OF SPECIMEN:  N/A  COUNTS:  YES  TOURNIQUET:  * No tourniquets in log *  DICTATION: .Other Dictation: Dictation Number [id number]  PLAN OF CARE: Admit for overnight observation  PATIENT DISPOSITION:  PACU - hemodynamically stable.

## 2015-09-01 NOTE — Discharge Instructions (Signed)

## 2015-09-01 NOTE — Anesthesia Postprocedure Evaluation (Signed)
Anesthesia Post Note  Patient: Vincent Hernandez  Procedure(s) Performed: Procedure(s) (LRB): ACDF C7-T1 (N/A)  Patient location during evaluation: PACU Anesthesia Type: General Level of consciousness: awake and alert Pain management: pain level controlled Vital Signs Assessment: post-procedure vital signs reviewed and stable Respiratory status: spontaneous breathing, nonlabored ventilation and respiratory function stable Cardiovascular status: blood pressure returned to baseline and stable Postop Assessment: no signs of nausea or vomiting Anesthetic complications: no    Last Vitals:  Filed Vitals:   09/01/15 1710 09/01/15 1725  BP: 109/60 118/74  Pulse: 94 90  Temp:  36.7 C  Resp: 16 16    Last Pain:  Filed Vitals:   09/01/15 1732  PainSc: 3                  Ilir Mahrt,W. EDMOND

## 2015-09-01 NOTE — Anesthesia Preprocedure Evaluation (Addendum)
Anesthesia Evaluation  Patient identified by MRN, date of birth, ID band Patient awake    Reviewed: Allergy & Precautions, NPO status , Patient's Chart, lab work & pertinent test results  History of Anesthesia Complications Negative for: history of anesthetic complications  Airway Mallampati: III  TM Distance: >3 FB Neck ROM: Full    Dental  (+) Teeth Intact, Dental Advisory Given   Pulmonary former smoker,    Pulmonary exam normal        Cardiovascular negative cardio ROS Normal cardiovascular exam     Neuro/Psych negative psych ROS   GI/Hepatic Neg liver ROS,   Endo/Other  Morbid obesity  Renal/GU negative Renal ROS     Musculoskeletal   Abdominal   Peds  Hematology   Anesthesia Other Findings   Reproductive/Obstetrics                            Anesthesia Physical Anesthesia Plan  ASA: III  Anesthesia Plan: General   Post-op Pain Management:    Induction: Intravenous  Airway Management Planned: Oral ETT  Additional Equipment:   Intra-op Plan:   Post-operative Plan: Extubation in OR  Informed Consent: I have reviewed the patients History and Physical, chart, labs and discussed the procedure including the risks, benefits and alternatives for the proposed anesthesia with the patient or authorized representative who has indicated his/her understanding and acceptance.   Dental advisory given  Plan Discussed with: CRNA, Anesthesiologist and Surgeon  Anesthesia Plan Comments:        Anesthesia Quick Evaluation                                  Anesthesia Evaluation  Patient identified by MRN, date of birth, ID band Patient awake    Reviewed: Allergy & Precautions, NPO status   Airway Mallampati: I TM Distance: >3 FB Neck ROM: Full    Dental  (+) Teeth Intact and Dental Advisory Given   Pulmonary  breath sounds clear to auscultation        Cardiovascular Rhythm:Regular Rate:Normal     Neuro/Psych  Headaches,    GI/Hepatic   Endo/Other  Morbid obesity  Renal/GU      Musculoskeletal   Abdominal   Peds  Hematology   Anesthesia Other Findings   Reproductive/Obstetrics                          Anesthesia Physical Anesthesia Plan  ASA: II  Anesthesia Plan: General   Post-op Pain Management:    Induction: Intravenous  Airway Management Planned: LMA  Additional Equipment:   Intra-op Plan:   Post-operative Plan: Extubation in OR  Informed Consent: I have reviewed the patients History and Physical, chart, labs and discussed the procedure including the risks, benefits and alternatives for the proposed anesthesia with the patient or authorized representative who has indicated his/her understanding and acceptance.   Dental advisory given  Plan Discussed with: CRNA, Anesthesiologist and Surgeon  Anesthesia Plan Comments:         Anesthesia Quick Evaluation                                   Anesthesia Evaluation  Patient identified by MRN, date of birth, ID band Patient awake    Reviewed: Allergy & Precautions,  NPO status   Airway Mallampati: I TM Distance: >3 FB Neck ROM: Full    Dental  (+) Teeth Intact and Dental Advisory Given   Pulmonary  breath sounds clear to auscultation        Cardiovascular Rhythm:Regular Rate:Normal     Neuro/Psych  Headaches,    GI/Hepatic   Endo/Other  Morbid obesity  Renal/GU      Musculoskeletal   Abdominal   Peds  Hematology   Anesthesia Other Findings   Reproductive/Obstetrics                          Anesthesia Physical Anesthesia Plan  ASA: II  Anesthesia Plan: General   Post-op Pain Management:    Induction: Intravenous  Airway Management Planned: LMA  Additional Equipment:   Intra-op Plan:   Post-operative Plan: Extubation in OR  Informed Consent: I have  reviewed the patients History and Physical, chart, labs and discussed the procedure including the risks, benefits and alternatives for the proposed anesthesia with the patient or authorized representative who has indicated his/her understanding and acceptance.   Dental advisory given  Plan Discussed with: CRNA, Anesthesiologist and Surgeon  Anesthesia Plan Comments:         Anesthesia Quick Evaluation

## 2015-09-02 ENCOUNTER — Encounter (HOSPITAL_COMMUNITY): Payer: Self-pay | Admitting: Orthopedic Surgery

## 2015-09-02 DIAGNOSIS — M5013 Cervical disc disorder with radiculopathy, cervicothoracic region: Secondary | ICD-10-CM | POA: Diagnosis not present

## 2015-09-02 NOTE — Progress Notes (Signed)
Occupational Therapy Evaluation Patient Details Name: Vincent Hernandez MRN: [id number] DOB: 1966-01-23 Today's Date: 09/02/2015    History of Present Illness Pt is a 50 y/o male who presents s/p C7-T1 ACDF on 09/01/15.   Clinical Impression   Pt admitted with the above diagnoses and presents with below problem list. PTA pt was independent with ADLs. Pt is currently setup to supervision with most ADLs. ADL education provided to pt and spouse. No further OT needs indicated at this time. OT signing off.     Follow Up Recommendations  No OT follow up;Supervision - Intermittent    Equipment Recommendations  None recommended by OT    Recommendations for Other Services       Precautions / Restrictions Precautions Precautions: Fall;Cervical Precaution Comments: reviewed precautions Required Braces or Orthoses: Cervical Brace Cervical Brace: Hard collar;At all times Restrictions Weight Bearing Restrictions: No      Mobility Bed Mobility Overal bed mobility: Modified Independent             General bed mobility comments: HOB elevated. Pt plans to sleep in recliner at home. Reviewed logroll technique for future use.   Transfers Overall transfer level: Needs assistance Equipment used: None Transfers: Sit to/from Stand Sit to Stand: Modified independent (Device/Increase time)         General transfer comment: from EOB.     Balance Overall balance assessment: Needs assistance Sitting-balance support: Feet supported;No upper extremity supported Sitting balance-Leahy Scale: Good     Standing balance support: No upper extremity supported Standing balance-Leahy Scale: Fair                              ADL Overall ADL's : Needs assistance/impaired Eating/Feeding: Set up;Sitting   Grooming: Supervision/safety;Standing   Upper Body Bathing: Set up;Sitting   Lower Body Bathing: Supervison/ safety;Sit to/from stand   Upper Body Dressing : Set up;Sitting    Lower Body Dressing: Sit to/from stand;Supervision/safety   Toilet Transfer: Supervision/safety;Ambulation   Toileting- Clothing Manipulation and Hygiene: Supervision/safety;Sitting/lateral lean;Sit to/from stand;Set up   Tub/ Shower Transfer: Supervision/safety;Ambulation;Shower seat   Functional mobility during ADLs: Supervision/safety General ADL Comments: Pt completed in-room functional mobility at supervision level. Educated on strategies and DME/AE for ADLs as needed. Spouse present during session. Educated on Art gallery manager. Pt plans to sleep in recliner at home.      Vision     Perception     Praxis      Pertinent Vitals/Pain Pain Assessment: Faces Faces Pain Scale: Hurts little more Pain Location: incision Pain Descriptors / Indicators: Sore;Operative site guarding Pain Intervention(s): Limited activity within patient's tolerance;Monitored during session;Repositioned     Hand Dominance Right   Extremity/Trunk Assessment Upper Extremity Assessment Upper Extremity Assessment: Overall WFL for tasks assessed   Lower Extremity Assessment Lower Extremity Assessment: Defer to PT evaluation   Cervical / Trunk Assessment Cervical / Trunk Assessment: Other exceptions Cervical / Trunk Exceptions: Cervical surgical inicison   Communication Communication Communication: Other (comment) (low speech volume due to sore throat)   Cognition Arousal/Alertness: Awake/alert Behavior During Therapy: WFL for tasks assessed/performed Overall Cognitive Status: Within Functional Limits for tasks assessed                     General Comments       Exercises       Shoulder Instructions      Home Living Family/patient expects to be discharged to:: Private residence  Living Arrangements: Spouse/significant other Available Help at Discharge: Family;Available 24 hours/day Type of Home: House Home Access: Level entry     Home Layout: Two level Alternate Level  Stairs-Number of Steps: Flight Alternate Level Stairs-Rails: Right Bathroom Shower/Tub: Occupational psychologist: Handicapped height Bathroom Accessibility: Yes   Home Equipment: Walker - 2 wheels;Grab bars - toilet;Shower seat          Prior Functioning/Environment Level of Independence: Independent             OT Diagnosis: Acute pain   OT Problem List:     OT Treatment/Interventions:      OT Goals(Current goals can be found in the care plan section) Acute Rehab OT Goals Patient Stated Goal: Home today  OT Frequency:     Barriers to D/C:            Co-evaluation              End of Session Equipment Utilized During Treatment: Cervical collar  Activity Tolerance: Patient tolerated treatment well Patient left: in bed;with call bell/phone within reach;with family/visitor present   Time: 1130-1140 OT Time Calculation (min): 10 min Charges:  OT General Charges $OT Visit: 1 Procedure OT Evaluation $OT Eval Low Complexity: 1 Procedure G-Codes: OT G-codes **NOT FOR INPATIENT CLASS** Functional Assessment Tool Used: clinical judgement Functional Limitation: Self care Self Care Current Status [id number]): At least 1 percent but less than 20 percent impaired, limited or restricted Self Care Goal Status [id number]): At least 1 percent but less than 20 percent impaired, limited or restricted Self Care Discharge Status [phone]): At least 1 percent but less than 20 percent impaired, limited or restricted  Hortencia Pilar 09/02/2015, 12:08 PM

## 2015-09-02 NOTE — Progress Notes (Signed)
    Subjective: Procedure(s) (LRB): ACDF C7-T1 (N/A) 1 Day Post-Op  Patient reports pain as 2 on 0-10 scale.  Reports decreased arm pain reports incisional neck pain   Positive void Negative bowel movement Positive flatus Negative chest pain or shortness of breath  Objective: Vital signs in last 24 hours: Temp:  [97.7 F (36.5 C)-98.8 F (37.1 C)] 98.1 F (36.7 C) (07/06 0420) Pulse Rate:  [86-103] 86 (07/06 0420) Resp:  [16-22] 22 (07/06 0420) BP: (108-137)/(48-77) 125/76 mmHg (07/06 0420) SpO2:  [93 %-97 %] 95 % (07/06 0420) Weight:  [126.1 kg (278 lb)] 126.1 kg (278 lb) (07/05 1106)  Intake/Output from previous day: 07/05 0701 - 07/06 0700 In: 1740 [P.O.:240; I.V.:1500] Out: 30 [Blood:30]  Labs: No results for input(s): WBC, RBC, HCT, PLT in the last 72 hours. No results for input(s): NA, K, CL, CO2, BUN, CREATININE, GLUCOSE, CALCIUM in the last 72 hours. No results for input(s): LABPT, INR in the last 72 hours.  Physical Exam: Neurologically intact ABD soft Incision: dressing C/D/I Compartment soft  Assessment/Plan: Patient stable  xrays satisfactory Mobilization with physical therapy Encourage incentive spirometry Continue care  Advance diet Up with therapy  Radicular arm pain improved No swelling or difficulty breathing Mobilization today - plan on d/c to home later today  Melina Schools, MD [facility] [phone]

## 2015-09-02 NOTE — Progress Notes (Signed)
Patient alert and oriented, mae's well, voiding adequate amount of urine, swallowing without difficulty, no c/o pain. Patient discharged home with family. Script and discharged instructions given to patient. Patient and family stated understanding of d/c instructions given and has an appointment with MD. 

## 2015-09-02 NOTE — Evaluation (Signed)
Physical Therapy Evaluation Patient Details Name: Vincent Hernandez MRN: [id number] DOB: [birth year] Today's Date: 09/02/2015   History of Present Illness  Pt is a 50 y/o male who presents s/p C7-T1 ACDF on 09/01/15.  Clinical Impression  Pt admitted with above diagnosis. Pt currently with functional limitations due to the deficits listed below (see PT Problem List). At the time of PT eval pt was able to perform transfers and ambulation with min guard to modified independence. Pt reports minimal pain and experience with prior surgeries so has all needed equipment at home already. Pt will benefit from skilled PT to increase their independence and safety with mobility to allow discharge to the venue listed below.       Follow Up Recommendations Outpatient PT;Supervision for mobility/OOB    Equipment Recommendations  None recommended by PT    Recommendations for Other Services       Precautions / Restrictions Precautions Precautions: Fall;Cervical Precaution Comments: Pt received handout and we reviewed in detail. Pt was cued for precautions during functional mobility.  Required Braces or Orthoses: Cervical Brace Cervical Brace: Hard collar;At all times Restrictions Weight Bearing Restrictions: No      Mobility  Bed Mobility Overal bed mobility: Modified Independent             General bed mobility comments: Pt was able to transition to EOB with HOB flat and no use of rails for support. Pt demonstrated good log roll technique.   Transfers Overall transfer level: Needs assistance Equipment used: None Transfers: Sit to/from Stand Sit to Stand: Modified independent (Device/Increase time)         General transfer comment: Pt was able to power-up to full standing position without assistance. No unsteadiness or LOB noted.   Ambulation/Gait Ambulation/Gait assistance: Supervision;Min guard Ambulation Distance (Feet): 250 Feet Assistive device: None Gait Pattern/deviations:  Step-through pattern;Decreased stride length Gait velocity: Decreased Gait velocity interpretation: Below normal speed for age/gender General Gait Details: Slow and guarded gait. Pt was min guard assist initially progressing to supervision for safety.   Stairs Stairs: Yes Stairs assistance: Supervision Stair Management: One rail Right;Step to pattern;Forwards Number of Stairs: 10 General stair comments: VC's for general safety and sequencing on the stairs. No assist required however supervision for safety was provided.   Wheelchair Mobility    Modified Rankin (Stroke Patients Only)       Balance Overall balance assessment: Needs assistance Sitting-balance support: Feet supported;No upper extremity supported Sitting balance-Leahy Scale: Good     Standing balance support: No upper extremity supported;During functional activity Standing balance-Leahy Scale: Fair                               Pertinent Vitals/Pain Pain Assessment: Faces Faces Pain Scale: Hurts little more Pain Location: Incision site Pain Descriptors / Indicators: Operative site guarding;Sore Pain Intervention(s): Limited activity within patient's tolerance;Monitored during session;Repositioned    Home Living Family/patient expects to be discharged to:: Private residence Living Arrangements: Spouse/significant other Available Help at Discharge: Family;Available 24 hours/day Type of Home: House Home Access: Level entry     Home Layout: Two level Home Equipment: Walker - 2 wheels;Grab bars - toilet;Shower seat      Prior Function Level of Independence: Independent               Hand Dominance   Dominant Hand: Right    Extremity/Trunk Assessment   Upper Extremity Assessment: Defer to OT evaluation  Lower Extremity Assessment: Overall WFL for tasks assessed      Cervical / Trunk Assessment: Other exceptions  Communication   Communication: No difficulties   Cognition Arousal/Alertness: Awake/alert Behavior During Therapy: WFL for tasks assessed/performed Overall Cognitive Status: Within Functional Limits for tasks assessed                      General Comments      Exercises        Assessment/Plan    PT Assessment Patient needs continued PT services  PT Diagnosis Difficulty walking;Acute pain   PT Problem List Decreased strength;Decreased range of motion;Decreased activity tolerance;Decreased balance;Decreased mobility;Decreased knowledge of use of DME;Decreased safety awareness;Decreased knowledge of precautions;Pain  PT Treatment Interventions DME instruction;Gait training;Stair training;Functional mobility training;Therapeutic activities;Therapeutic exercise;Neuromuscular re-education;Patient/family education   PT Goals (Current goals can be found in the Care Plan section) Acute Rehab PT Goals Patient Stated Goal: Home today PT Goal Formulation: With patient Time For Goal Achievement: 09/09/15 Potential to Achieve Goals: Good    Frequency Min 5X/week   Barriers to discharge        Co-evaluation               End of Session Equipment Utilized During Treatment: Cervical collar Activity Tolerance: Patient tolerated treatment well Patient left: in chair;with call bell/phone within reach Nurse Communication: Mobility status    Functional Assessment Tool Used: Clinical judgement Functional Limitation: Mobility: Walking and moving around Mobility: Walking and Moving Around Current Status [id number]): At least 1 percent but less than 20 percent impaired, limited or restricted Mobility: Walking and Moving Around Goal Status [phone]): At least 1 percent but less than 20 percent impaired, limited or restricted    Time: 0737-0756 PT Time Calculation (min) (ACUTE ONLY): 19 min   Charges:   PT Evaluation $PT Eval Moderate Complexity: 1 Procedure     PT G Codes:   PT G-Codes **NOT FOR INPATIENT CLASS** Functional  Assessment Tool Used: Clinical judgement Functional Limitation: Mobility: Walking and moving around Mobility: Walking and Moving Around Current Status [id number]): At least 1 percent but less than 20 percent impaired, limited or restricted Mobility: Walking and Moving Around Goal Status [phone]): At least 1 percent but less than 20 percent impaired, limited or restricted    Rolinda Roan 09/02/2015, 8:51 AM   Rolinda Roan, PT, DPT Acute Rehabilitation Services Pager: [phone]

## 2015-09-28 ENCOUNTER — Other Ambulatory Visit: Payer: Self-pay | Admitting: Physician Assistant

## 2015-09-28 NOTE — Progress Notes (Signed)
Patient ID: Vincent Hernandez, male   DOB: 21-Mar-1965, 50 y.o.   MRN: [id number]

## 2015-09-28 NOTE — Discharge Summary (Addendum)
Patient ID: Vincent Hernandez, male   DOB: 01-21-1966, 50 y.o.   MRN: [id number] Physician Discharge Summary  Patient ID: Vincent Hernandez MRN: [id number] DOB/AGE: December 14, 1965 50 y.o.  Admit date: 09/01/2015 Discharge date: 09/28/2015  Admission Diagnoses:  Cervical DDD Discharge Diagnoses:  Active Problems:   Neck pain   Past Medical History:  Diagnosis Date  . Arthritis   . GERD (gastroesophageal reflux disease)   . Headache(784.0)    Migraines  . Herniated disc, cervical    through T1  . Neuromuscular disorder ([place])     Surgeries: Procedure(s): ACDF C7-T1 on 09/01/2015   Consultants (if any):   Discharged Condition: Improved  Hospital Course: Vincent Hernandez is an 50 y.o. male who was admitted 09/01/2015 with a diagnosis of <principal problem not specified> and went to the operating room on 09/01/2015 and underwent the above named procedures.  Pt was discharged and will f/u with surgeon in 2 weeks post op.  He was given perioperative antibiotics:  Anti-infectives    Start     Dose/Rate Route Frequency Ordered Stop   09/01/15 2130  ceFAZolin (ANCEF) IVPB 1 g/50 mL premix     1 g 100 mL/hr over 30 Minutes Intravenous Every 8 hours 09/01/15 1801 09/02/15 0505   09/01/15 1200  ceFAZolin (ANCEF) 3 g in dextrose 5 % 50 mL IVPB     3 g 130 mL/hr over 30 Minutes Intravenous To ShortStay Surgical 08/30/15 1032 09/01/15 1344    .  He was given sequential compression devices, early ambulation, and TED for DVT prophylaxis.  He benefited maximally from the hospital stay and there were no complications.    Recent vital signs:  Vitals:   09/02/15 0420 09/02/15 0730  BP: 125/76 128/75  Pulse: 86 91  Resp: (!) 22 20  Temp: 98.1 F (36.7 C) 98.1 F (36.7 C)    Recent laboratory studies:  Lab Results  Component Value Date   HGB 15.2 08/27/2015   HGB 15.0 07/22/2015   HGB 14.8 11/21/2012   Lab Results  Component Value Date   WBC 10.1 08/27/2015   PLT 315 08/27/2015   No  results found for: INR Lab Results  Component Value Date   NA 136 07/22/2015   K 3.9 07/22/2015   CL 103 07/22/2015   CO2 25 07/22/2015   BUN 17 07/22/2015   CREATININE 0.84 07/22/2015   GLUCOSE 88 07/22/2015    Discharge Medications:     Medication List    STOP taking these medications   aspirin 81 MG tablet   aspirin-acetaminophen-caffeine 250-250-65 MG tablet Commonly known as:  EXCEDRIN MIGRAINE   B-complex with vitamin C tablet   CRANBERRY PO   fish oil-omega-3 fatty acids 1000 MG capsule   GLUCOSAMINE PO   HYDROcodone-acetaminophen 5-325 MG tablet Commonly known as:  NORCO/VICODIN   multivitamin with minerals Tabs tablet   pregabalin 75 MG capsule Commonly known as:  LYRICA     TAKE these medications   cyclobenzaprine 10 MG tablet Commonly known as:  FLEXERIL Take 1 tablet (10 mg total) by mouth 3 (three) times daily as needed for muscle spasms. What changed:  when to take this  reasons to take this   ondansetron 4 MG tablet Commonly known as:  ZOFRAN Take 1 tablet (4 mg total) by mouth every 8 (eight) hours as needed for nausea or vomiting.   oxyCODONE-acetaminophen 10-325 MG tablet Commonly known as:  PERCOCET Take 1 tablet by mouth every 4 (four) hours  as needed for pain.   SUMAtriptan 50 MG tablet Commonly known as:  IMITREX Take 50 mg by mouth every 2 (two) hours as needed for migraine. May repeat in 2 hours if headache persists or recurs.       Diagnostic Studies: Dg Cervical Spine 2 Or 3 Views  Result Date: 09/01/2015 CLINICAL DATA:  Post cervical spine fusion EXAM: CERVICAL SPINE - 2-3 VIEW COMPARISON:  Intraoperative films same day FINDINGS: Three views of the cervical spine submitted. Again noted status post anterior fusion at C7-T1 level. There is anatomic alignment. IMPRESSION: Status post anterior fusion at C7-T1 level. The alignment is preserved. Electronically Signed   By: Lahoma Crocker M.D.   On: 09/01/2015 17:07   Dg Cervical  Spine 2-3 Views  Result Date: 09/01/2015 CLINICAL DATA:  C7-T1 ACDF EXAM: DG C-ARM 61-120 MIN; CERVICAL SPINE - 2-3 VIEW COMPARISON:  None. FINDINGS: Three spot fluoro images are submitted after the procedure. Small field-of-view and lack of preoperative imaging makes assessment of particular levels difficult. By report, the patient is status post surgery C7-T1. On the lateral film, the inferior endplate of the incompletely visualized cranial most vertebral body does appear to represent C2. As such, this would place the interbody fusion device at C7-T1. Loss of disc height is seen at C6-7. Frontal projection is also most compatible with the interbody fusion device being at the C7-T1 level. IMPRESSION: Status post ACDF at C7-T1. No evidence for immediate hardware complications. Electronically Signed   By: Misty Stanley M.D.   On: 09/01/2015 16:54   Dg C-arm 1-60 Min  Result Date: 09/01/2015 CLINICAL DATA:  C7-T1 ACDF EXAM: DG C-ARM 61-120 MIN; CERVICAL SPINE - 2-3 VIEW COMPARISON:  None. FINDINGS: Three spot fluoro images are submitted after the procedure. Small field-of-view and lack of preoperative imaging makes assessment of particular levels difficult. By report, the patient is status post surgery C7-T1. On the lateral film, the inferior endplate of the incompletely visualized cranial most vertebral body does appear to represent C2. As such, this would place the interbody fusion device at C7-T1. Loss of disc height is seen at C6-7. Frontal projection is also most compatible with the interbody fusion device being at the C7-T1 level. IMPRESSION: Status post ACDF at C7-T1. No evidence for immediate hardware complications. Electronically Signed   By: Misty Stanley M.D.   On: 09/01/2015 16:54    Disposition: 01-Home or Self Care    Follow-up Information    Laria Grimmett D, MD. Schedule an appointment as soon as possible for a visit in 2 weeks.   Specialty:  Orthopedic Surgery Why:  If symptoms worsen,  For suture removal, For wound re-check Contact information: [address] [place] [address] [phone]            Signed: Valinda Hoar 09/28/2015, 2:42 PM  Agree with above

## 2015-10-06 ENCOUNTER — Encounter: Payer: Self-pay | Admitting: Physician Assistant

## 2015-10-07 NOTE — H&P (Signed)
History of Present Illness  The patient is a 50 year old male who comes in today for a preoperative History and Physical. The patient is scheduled for a ACDF C7-T1 to be performed by Dr. Duane Lope D. Rolena Infante, MD at [facility] on 09/01/15 . Please see the hospital record for complete dictated history and physical.  Additional reasons for visit:  Transition into care is described as the following: The patient is transitioning into care and a summary of care was reviewed.   Problem List/Past Medical Neck pain (M54.2)  Problems Reconciled   Allergies No Known Drug Allergies [11/19/2014]: Allergies Reconciled   Family History  Depression  grandmother fathers side and grandfather fathers side Rheumatoid Arthritis  First Degree Relatives. mother Cancer  First Degree Relatives. father Hypertension  mother Diabetes Mellitus  grandmother fathers side  Social History Hassie Bruce. [place]; 08/27/2015 9:58 AM) Tobacco use  Never smoker. never smoker  Medication History  Lyrica (75MG  Capsule, 1 (one) Capsule Oral two times daily, Taken starting 08/12/2015) Active. Norco (5-325MG  Tablet, 1 (one) Oral three times daily, as needed, Taken starting 07/29/2015) Active. Fish Oil Concentrate (435MG  Capsule, 1 (one) Oral) Active. Aleve (220MG  Tablet, Oral as needed) Active. (prn) Aspirin (81MG  Tablet, Oral) Active. (qd) Vitamin B Complex (Oral) Active. (qd) Vitamin C (100MG  Tablet, Oral) Active. (qd) Glucosamine Chondroitin Complx (Oral) Active. (qd) Excedrin Migraine (250-250-65MG  Tablet, Oral) Active. (prn migrrane) Cranberry Concentrate (140-100-3MG -MG-UNIT Capsule, Oral) Active. (qd) Medications Reconciled  Vitals  08/27/2015 10:05 AM Weight: 282.03 lb Height: 68.5in Body Surface Area: 2.38 m Body Mass Index: 42.26 kg/m  Temp.: 98.28F  Pulse: 93 (Regular)  BP: 163/116 (Sitting, Left Arm, Standard)  General General Appearance-Not in acute  distress. Orientation-Oriented X3. Build & Nutrition-Well nourished and Well developed.  Integumentary General Characteristics Surgical Scars - no surgical scar evidence of previous cervical surgery. Cervical Spine-Skin examination of the cervical spine is without deformity, skin lesions, lacerations or abrasions.  Chest and Lung Exam Auscultation Breath sounds - Normal and Clear.  Cardiovascular Auscultation Rhythm - Regular rate and rhythm.  Peripheral Vascular Upper Extremity Palpation - Radial pulse - Bilateral - 2+.  Neurologic Sensation Upper Extremity - Left - sensation is diminished in the upper extremity. Reflexes Biceps Reflex - Bilateral - 2+. Brachioradialis Reflex - Bilateral - 2+. Triceps Reflex - Bilateral - 2+. Hoffman's Sign - Bilateral - Hoffman's sign not present.  Musculoskeletal Spine/Ribs/Pelvis  Cervical Spine : Inspection and Palpation - Tenderness - right cervical paraspinals tender to palpation, left cervical paraspinals tender to palpation and left trapezius tender to palpation. Strength and Tone: Strength: Strength: Strength - Deltoid - Bilateral - 5/5. Biceps - Bilateral - 5/5. Triceps - Bilateral - 5/5. Wrist Extension - Bilateral - 5/5. Right - 5/5. Hand Grip - Left - 3/5. Heel walk - Bilateral - able to heel walk without difficulty. Toe Walk - Bilateral - able to walk on toes without difficulty. Heel-Toe Walk - Bilateral - able to heel-toe walk without difficulty. ROM - Flexion - Moderately Decreased and painful. Extension - Moderately Decreased and painful. Left Lateral Flexion - Moderately Decreased and painful. Right Lateral Flexion - Moderately Decreased and painful. Left Rotation - Moderately Decreased and painful. Right Rotation - Moderately Decreased and painful. Pain - . Cervical Spine - Special Testing - axial compression test negative, cross chest impingement test negative. Non-Anatomic Signs - No non-anatomic signs present. Upper  Extremity Range of Motion - No truesholder pain with IR/ER of the shoulders.    Assessment &  Plan  At this point in time, his MRI clearly shows a large left C7-T1 disc herniation with neural compression of the C8 nerve root. At this point having failed conservative management, we are going to move forward with an ACDF at C7-T1. All appropriate risks, benefits, and alternatives were discussed with the patient.  Anterior cervical fusion:Risks of surgery include, but are not limited to: Throat pain, swallowing difficulty, hoarseness or change in voice, death, stroke, paralysis, nerve root damage/injury, bleeding, blood clots, loss of bowel/bladder control, hardware failure, or mal-position, spinal fluid leak, adjacent segment disease, non-union, need for further surgery, ongoing or worse pain, infection. Post-operative bleeding or swelling that could require emergent surgery. Goal Of Surgery: Discussed that goal of surgery is to reduce pain and improve function and quality of life. Patient is aware that despite all appropriate treatment that there pain and function could be the same, worse, or different.

## 2017-01-12 DIAGNOSIS — M25562 Pain in left knee: Secondary | ICD-10-CM | POA: Diagnosis not present

## 2017-01-24 DIAGNOSIS — S83241D Other tear of medial meniscus, current injury, right knee, subsequent encounter: Secondary | ICD-10-CM | POA: Diagnosis not present

## 2017-01-24 DIAGNOSIS — M1711 Unilateral primary osteoarthritis, right knee: Secondary | ICD-10-CM | POA: Diagnosis not present

## 2017-02-03 IMAGING — CT CT ANGIO CHEST-ABD-PELV FOR DISSECTION W/ AND WO/W CM
2 of 9 series · 15 of 46 positions shown, 17 images · IV contrast (OMNI)
Comparison: Chest radiograph from 07/22/2015

CLINICAL DATA: Acute onset of left-sided chest pain, radiating to
the left shoulder, left axilla and left upper arm. Initial
encounter.

EXAM:
CT ANGIOGRAPHY CHEST, ABDOMEN AND PELVIS
TECHNIQUE: Multidetector CT imaging through the chest, abdomen and pelvis was
performed using the standard protocol during bolus administration of
intravenous contrast. Multiplanar reconstructed images and MIPs were
obtained and reviewed to evaluate the vascular anatomy.
CONTRAST:  100 mL of Isovue 370 IV contrast

[Series 6: dissection 3.0 i30f 3 · axial · 0.98mm/px · z∈[+766,+1339]mm · 12 of 217 slices shown, 14 images]
[im 13/217  soft-tissue]
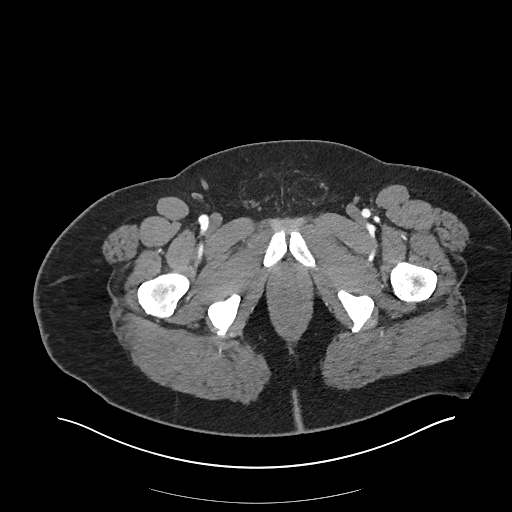
[im 13/217  bone]
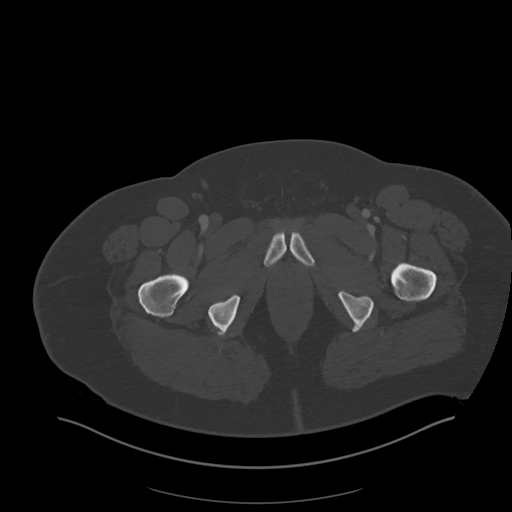
[im 39/217  soft-tissue]
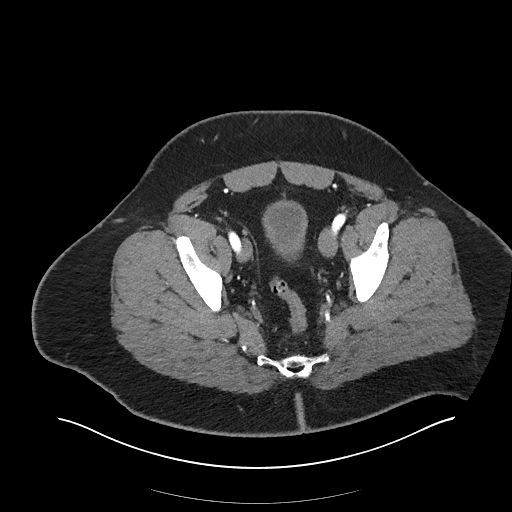
[im 51/217  soft-tissue]
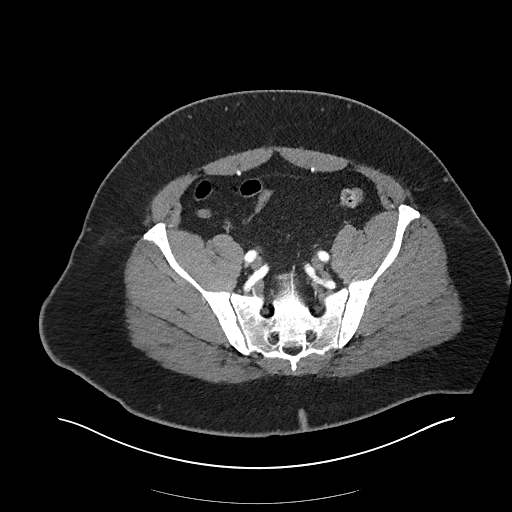
[im 64/217  soft-tissue]
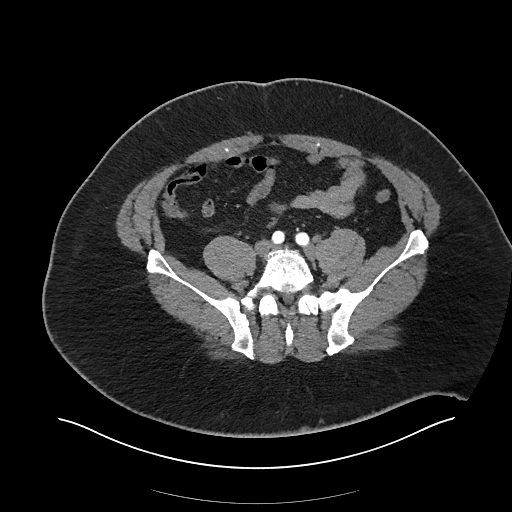
[im 89/217  soft-tissue]
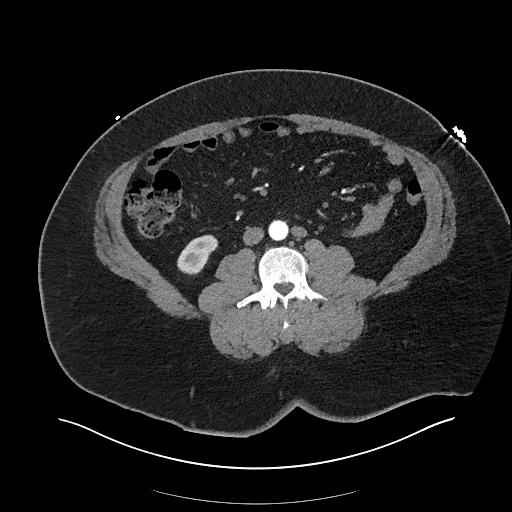
[im 102/217  soft-tissue]
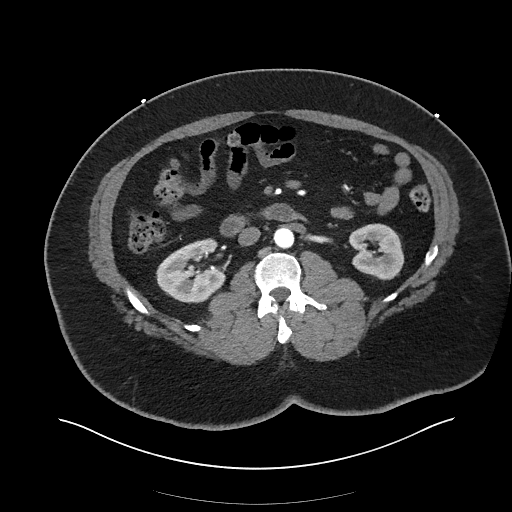
[im 115/217  soft-tissue]
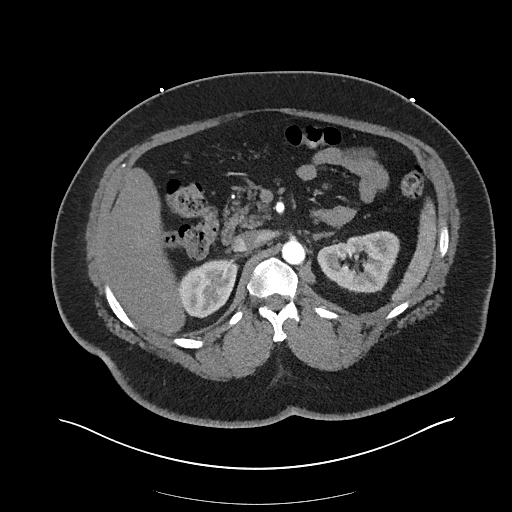
[im 140/217  soft-tissue]
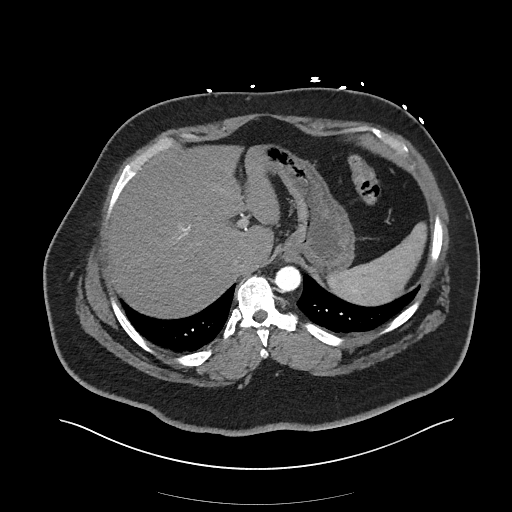
[im 153/217  soft-tissue]
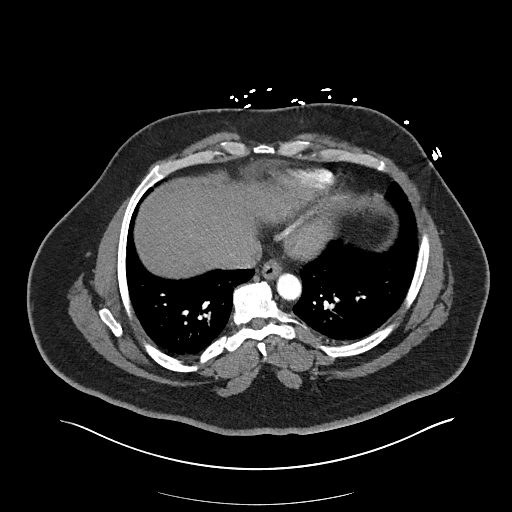
[im 153/217  bone]
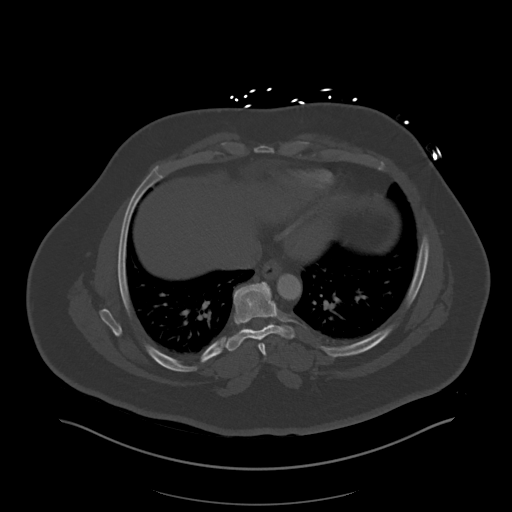
[im 166/217  soft-tissue]
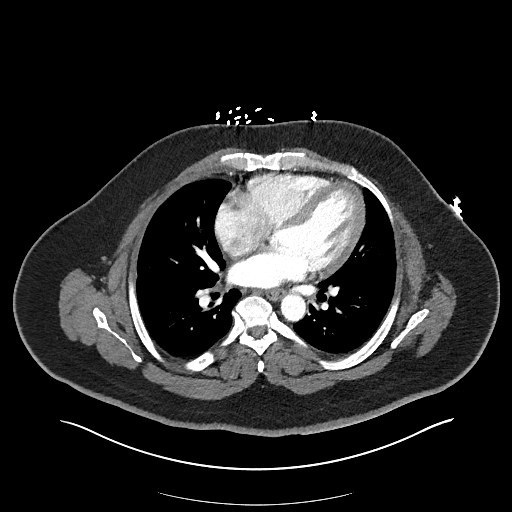
[im 191/217  soft-tissue]
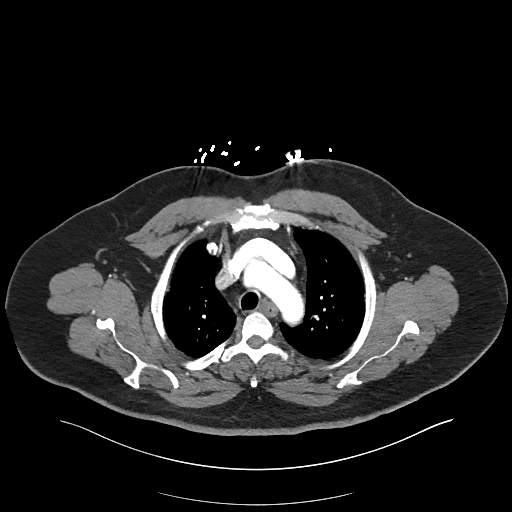
[im 204/217  soft-tissue]
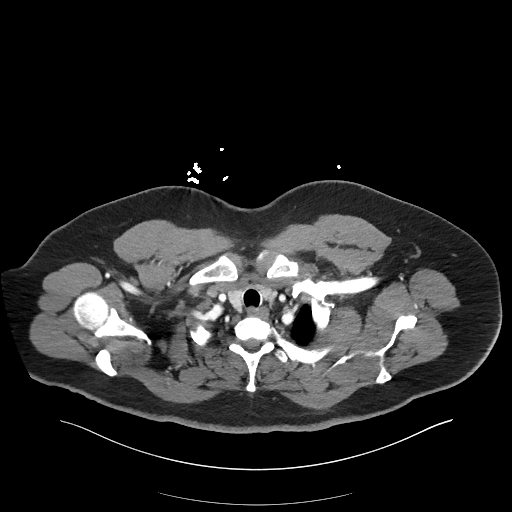

[Series 9: coronals · coronal · 0.95mm/px · 3 of 178 slices shown]
[im 45/178  soft-tissue]
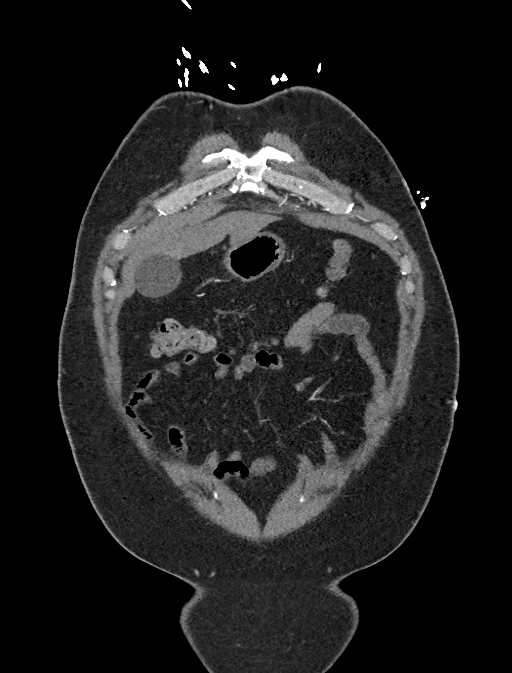
[im 89/178  soft-tissue]
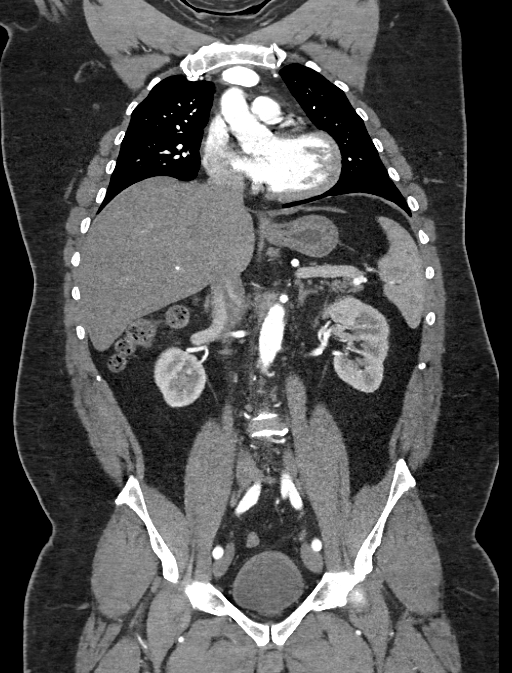
[im 133/178  soft-tissue]
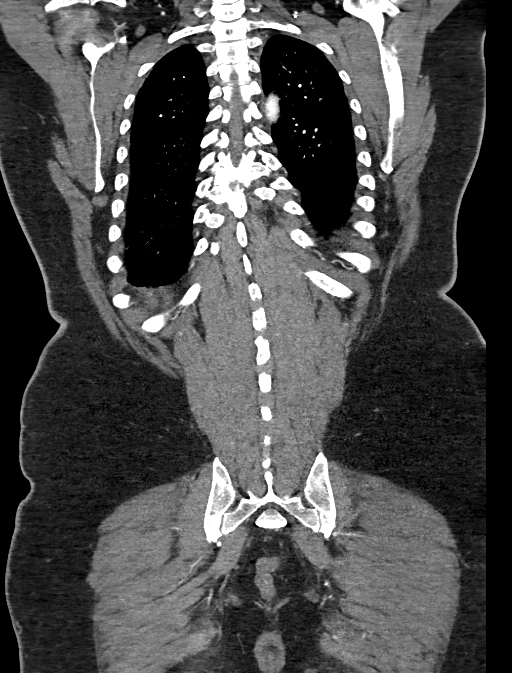

[15 of 46 positions shown; findings below may reference images not displayed]

FINDINGS: CTA CHEST FINDINGS

There is no evidence of aortic dissection. There is no evidence of
aneurysmal dilatation. No calcific atherosclerotic disease is seen.
The great vessels are grossly unremarkable in appearance.

There is no evidence of pulmonary embolus.

The lungs are essentially clear bilaterally. There is no evidence of
significant focal consolidation, pleural effusion or pneumothorax.
No masses are identified; no abnormal focal contrast enhancement is
seen.

The mediastinum is unremarkable in appearance. No mediastinal
lymphadenopathy is seen. No pericardial effusion is identified. The
great vessels are grossly unremarkable in appearance. After No
axillary lymphadenopathy is seen. The visualized portions of the
thyroid gland are unremarkable in appearance.

No acute osseous abnormalities are seen.

Review of the MIP images confirms the above findings.

CTA ABDOMEN AND PELVIS FINDINGS

There is no evidence of aortic dissection. There is no evidence of
aneurysmal dilatation. No calcific atherosclerotic disease is seen.

The celiac trunk, superior mesenteric artery, bilateral renal
arteries and inferior mesenteric artery are grossly unremarkable in
appearance. The inferior vena cava is grossly unremarkable in
appearance.

A 0.9 cm hypodensity is noted at the right hepatic lobe. The liver
and spleen are otherwise unremarkable. The gallbladder is within
normal limits. The pancreas and adrenal glands are unremarkable.

The kidneys are unremarkable in appearance. There is no evidence of
hydronephrosis. No renal or ureteral stones are seen. No perinephric
stranding is appreciated.

No free fluid is identified. The small bowel is unremarkable in
appearance. The stomach is within normal limits. No acute vascular
abnormalities are seen.

The appendix is normal in caliber, without evidence of appendicitis.
The colon is largely decompressed and is grossly unremarkable in
appearance.

The bladder is mildly distended and grossly unremarkable. The
prostate remains normal in size, with minimal calcification. No
inguinal lymphadenopathy is seen.

No acute osseous abnormalities are identified.

Review of the MIP images confirms the above findings.
IMPRESSION: 1. No evidence of aortic dissection. No evidence of aneurysmal
dilatation. No calcific atherosclerotic disease seen.
2. No evidence of pulmonary embolus.
3. Lungs clear bilaterally.
4. 0.9 cm hypodensity in the right hepatic lobe. This is
nonspecific, but may reflect a small cyst.

## 2017-02-07 ENCOUNTER — Encounter: Payer: Self-pay | Admitting: Physician Assistant

## 2017-02-07 ENCOUNTER — Other Ambulatory Visit: Payer: Self-pay

## 2017-02-07 ENCOUNTER — Ambulatory Visit (INDEPENDENT_AMBULATORY_CARE_PROVIDER_SITE_OTHER): Payer: BLUE CROSS/BLUE SHIELD | Admitting: Physician Assistant

## 2017-02-07 VITALS — BP 140/88 | HR 106 | Temp 98.5°F | Resp 18 | Ht 69.0 in | Wt 283.2 lb

## 2017-02-07 DIAGNOSIS — Z114 Encounter for screening for human immunodeficiency virus [HIV]: Secondary | ICD-10-CM | POA: Diagnosis not present

## 2017-02-07 DIAGNOSIS — Z01818 Encounter for other preprocedural examination: Secondary | ICD-10-CM

## 2017-02-07 LAB — COMPREHENSIVE METABOLIC PANEL
ALBUMIN: 4.2 g/dL (ref 3.5–5.5)
ALK PHOS: 80 IU/L (ref 39–117)
ALT: 35 IU/L (ref 0–44)
AST: 26 IU/L (ref 0–40)
Albumin/Globulin Ratio: 1.7 (ref 1.2–2.2)
BILIRUBIN TOTAL: 0.3 mg/dL (ref 0.0–1.2)
BUN/Creatinine Ratio: 27 — ABNORMAL HIGH (ref 9–20)
BUN: 22 mg/dL (ref 6–24)
CO2: 23 mmol/L (ref 20–29)
Calcium: 9.4 mg/dL (ref 8.7–10.2)
Chloride: 104 mmol/L (ref 96–106)
Creatinine, Ser: 0.82 mg/dL (ref 0.76–1.27)
GFR calc Af Amer: 118 mL/min/{1.73_m2} (ref >59)
GFR, EST NON AFRICAN AMERICAN: 102 mL/min/{1.73_m2} (ref >59)
GLOBULIN, TOTAL: 2.5 g/dL (ref 1.5–4.5)
GLUCOSE: 108 mg/dL — AB (ref 65–99)
POTASSIUM: 4.1 mmol/L (ref 3.5–5.2)
Sodium: 141 mmol/L (ref 134–144)
Total Protein: 6.7 g/dL (ref 6.0–8.5)

## 2017-02-07 LAB — CBC WITH DIFFERENTIAL/PLATELET
BASOS ABS: 0 10*3/uL (ref 0.0–0.2)
Basos: 0 %
EOS (ABSOLUTE): 0.3 10*3/uL (ref 0.0–0.4)
Eos: 3 %
HEMOGLOBIN: 14.6 g/dL (ref 13.0–17.7)
Hematocrit: 40.3 % (ref 37.5–51.0)
Immature Grans (Abs): 0 10*3/uL (ref 0.0–0.1)
Immature Granulocytes: 0 %
LYMPHS ABS: 1.8 10*3/uL (ref 0.7–3.1)
LYMPHS: 22 %
MCH: 32.8 pg (ref 26.6–33.0)
MCHC: 36.2 g/dL — AB (ref 31.5–35.7)
MCV: 91 fL (ref 79–97)
MONOCYTES: 6 %
MONOS ABS: 0.5 10*3/uL (ref 0.1–0.9)
NEUTROS ABS: 5.6 10*3/uL (ref 1.4–7.0)
NEUTROS PCT: 69 %
Platelets: 300 10*3/uL (ref 150–379)
RBC: 4.45 x10E6/uL (ref 4.14–5.80)
RDW: 13.6 % (ref 12.3–15.4)
WBC: 8.3 10*3/uL (ref 3.4–10.8)

## 2017-02-07 NOTE — Progress Notes (Signed)
Patient ID: Vincent Hernandez, male    DOB: Jun 21, 1965, 51 y.o.   MRN: [id number]  PCP: System, Pcp Not In  Chief Complaint  Patient presents with  . Pre-OP Clearance    Pt is having surgery on his right knee.    Subjective:   Presents for evaluation for pre-operative clearance in preparation for RIGHT total knee replacement with Dr. Theda Hernandez.  I last saw him last for same, in preparation for ACDF C7-T1 07/2015.  9 previous surgeries without complications of anesthesia. His medical problems include migraine headache and obesity. Father had a stroke at age 52. No personal or family history of cardiac events.   Review of Systems  Constitutional: Negative.   HENT: Negative.   Eyes: Positive for photophobia (only with migraine) and visual disturbance (only with migraine). Negative for pain, discharge, redness and itching.  Respiratory: Negative.   Cardiovascular: Negative.   Gastrointestinal: Positive for nausea (only with severe migraine). Negative for abdominal distention, abdominal pain, anal bleeding, blood in stool, constipation, diarrhea, rectal pain and vomiting.  Endocrine: Negative.   Genitourinary: Negative.   Musculoskeletal: Positive for arthralgias, back pain (due to altered gait), gait problem and myalgias. Negative for neck pain and neck stiffness.  Skin: Negative.   Allergic/Immunologic: Negative.   Neurological: Positive for headaches. Negative for dizziness, tremors, seizures, syncope, facial asymmetry, speech difficulty, weakness, light-headedness and numbness.  Hematological: Negative for adenopathy. Does not bruise/bleed easily.  Psychiatric/Behavioral: Negative.        Patient Active Problem List   Diagnosis Date Noted  . Neck pain 09/01/2015  . Migraine headache 08/14/2015  . Herniated disc, cervical 08/14/2015  . BMI 40.0-44.9, adult ([place]) 08/14/2015     Prior to Admission medications   Medication Sig Start Date End Date Taking? Authorizing  Provider  cyclobenzaprine (FLEXERIL) 10 MG tablet Take 1 tablet (10 mg total) by mouth 3 (three) times daily as needed for muscle spasms. 09/01/15  Yes Vincent Schools, MD  ondansetron (ZOFRAN) 4 MG tablet Take 1 tablet (4 mg total) by mouth every 8 (eight) hours as needed for nausea or vomiting. 09/01/15  Yes Vincent Schools, MD  oxyCODONE-acetaminophen (PERCOCET) 10-325 MG tablet Take 1 tablet by mouth every 4 (four) hours as needed for pain. 09/01/15  Yes Vincent Schools, MD  SUMAtriptan (IMITREX) 50 MG tablet Take 50 mg by mouth every 2 (two) hours as needed for migraine. May repeat in 2 hours if headache persists or recurs.   Yes [provider]     Allergies  Allergen Reactions  . Robaxin [Methocarbamol] Itching  . Tdap [Diphth-Acell Pertussis-Tetanus] Other (See Comments)    "Locked up" ?       Objective:  Physical Exam  Constitutional: He is oriented to person, place, and time. He appears well-developed and well-nourished. He is active and cooperative. No distress.  BP 140/88 (BP Location: Right Arm, Patient Position: Sitting, Cuff Size: Large)   Pulse (!) 106   Temp 98.5 F (36.9 C) (Oral)   Resp 18   Ht 5\' 9"  (1.753 m)   Wt 283 lb 3.2 oz (128.5 kg)   SpO2 97%   BMI 41.82 kg/m   HENT:  Head: Normocephalic and atraumatic.  Right Ear: Hearing normal.  Left Ear: Hearing normal.  Eyes: Conjunctivae are normal. No scleral icterus.  Neck: Normal range of motion. Neck supple. No thyromegaly present.  Cardiovascular: Normal rate, regular rhythm and normal heart sounds.  Pulses:      Radial pulses  are 2+ on the right side, and 2+ on the left side.  Pulmonary/Chest: Effort normal and breath sounds normal.  Abdominal: Bowel sounds are normal. He exhibits no distension and no mass. There is no tenderness. There is no rebound and no guarding.  Lymphadenopathy:       Head (right side): No tonsillar, no preauricular, no posterior auricular and no occipital adenopathy present.        Head (left side): No tonsillar, no preauricular, no posterior auricular and no occipital adenopathy present.    He has no cervical adenopathy.       Right: No supraclavicular adenopathy present.       Left: No supraclavicular adenopathy present.  Neurological: He is alert and oriented to person, place, and time. No sensory deficit.  Skin: Skin is warm, dry and intact. No rash noted. No cyanosis or erythema. Nails show no clubbing.  Psychiatric: He has a normal mood and affect. His speech is normal and behavior is normal.       EKG reviewed with Dr. Tamala Julian. NSR. Rate 79. PR 178. QT 374. Compared to tracing 07/24/2015, which showed possible LEFT atrial enlargement which is not present on today's tracing, but otherwise unchanged.  Wt Readings from Last 3 Encounters:  02/07/17 283 lb 3.2 oz (128.5 kg)  09/01/15 278 lb (126.1 kg)  08/27/15 278 lb 2 oz (126.2 kg)       Assessment & Plan:   1. Pre-operative clearance Normal EKG. Anticipate normal labs. Will forward to Dr. Theda Hernandez when resulted.  Low surgical risk. - EKG 12-Lead - CBC with Differential/Platelet - Comprehensive metabolic panel  2. Screening for HIV (human immunodeficiency virus) - HIV antibody    No Follow-up on file.   Vincent Chute, PA-C [facility] [place]

## 2017-02-07 NOTE — Patient Instructions (Signed)
     IF you received an x-ray today, you will receive an invoice from [facility]. Please contact [facility] at [phone] with questions or concerns regarding your invoice.   IF you received labwork today, you will receive an invoice from [facility]. Please contact [facility] at [phone] with questions or concerns regarding your invoice.   Our billing staff will not be able to assist you with questions regarding bills from these companies.  You will be contacted with the lab results as soon as they are available. The fastest way to get your results is to activate your My Chart account. Instructions are located on the last page of this paperwork. If you have not heard from us regarding the results in 2 weeks, please contact this office.     

## 2017-02-08 LAB — HIV ANTIBODY (ROUTINE TESTING W REFLEX): HIV Screen 4th Generation wRfx: NONREACTIVE

## 2017-02-09 ENCOUNTER — Encounter: Payer: Self-pay | Admitting: Physician Assistant

## 2017-02-13 NOTE — Telephone Encounter (Signed)
Pre-op clearance form completed. Please fax as requested.

## 2017-03-15 IMAGING — RF DG C-ARM 61-120 MIN
1 series · 3 of 3 positions shown · non-contrast
Comparison: None.

CLINICAL DATA: C7-T1 ACDF

EXAM:
DG C-ARM 61-120 MIN; CERVICAL SPINE - 2-3 VIEW

[Series 1: run · 3 of 3 slices shown]
[im 1/3]
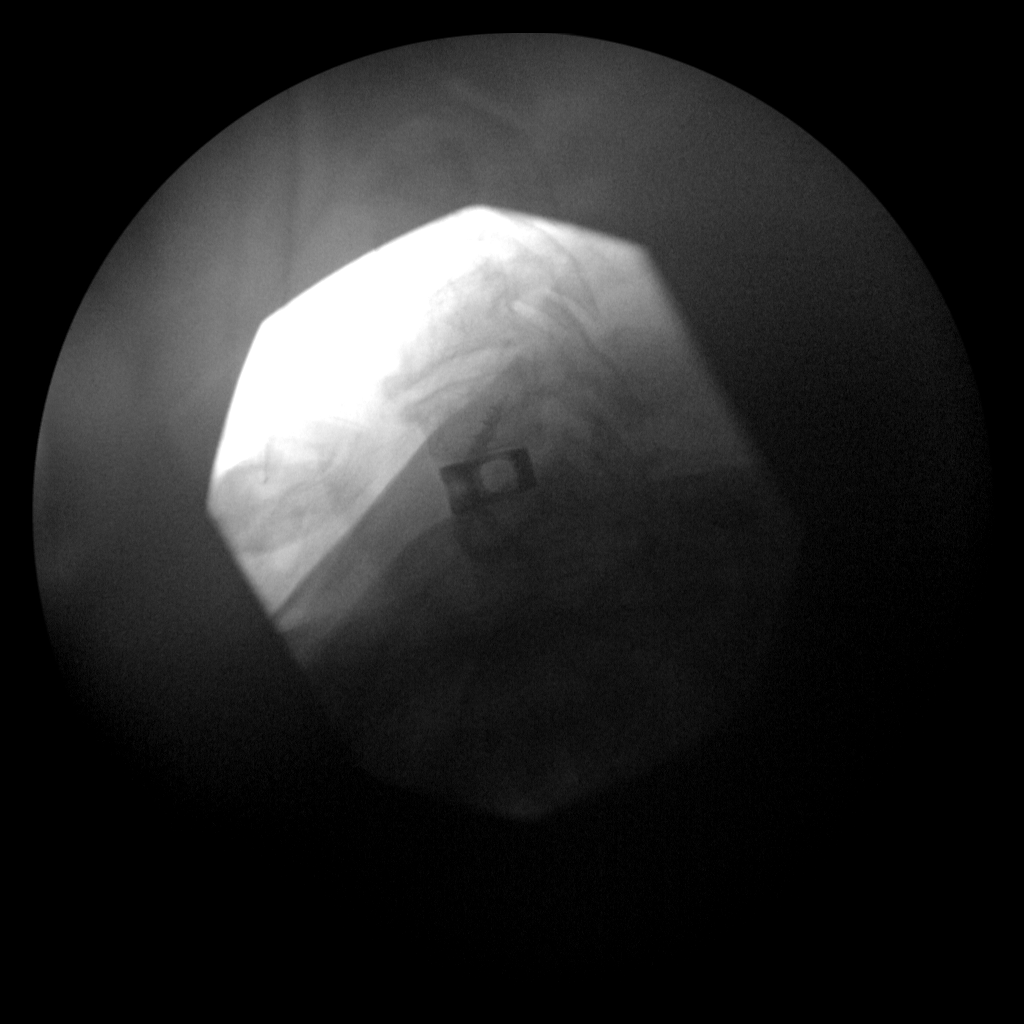
[im 2/3]
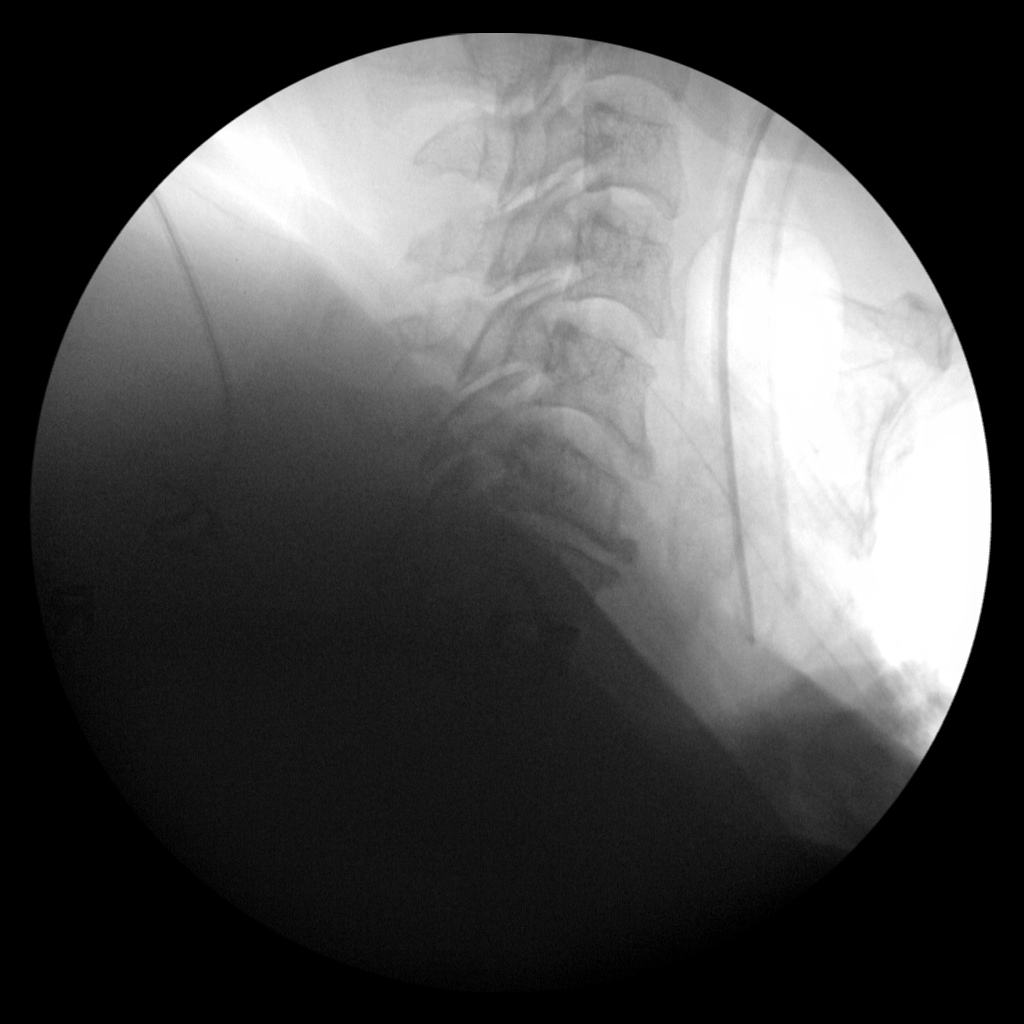
[im 3/3]
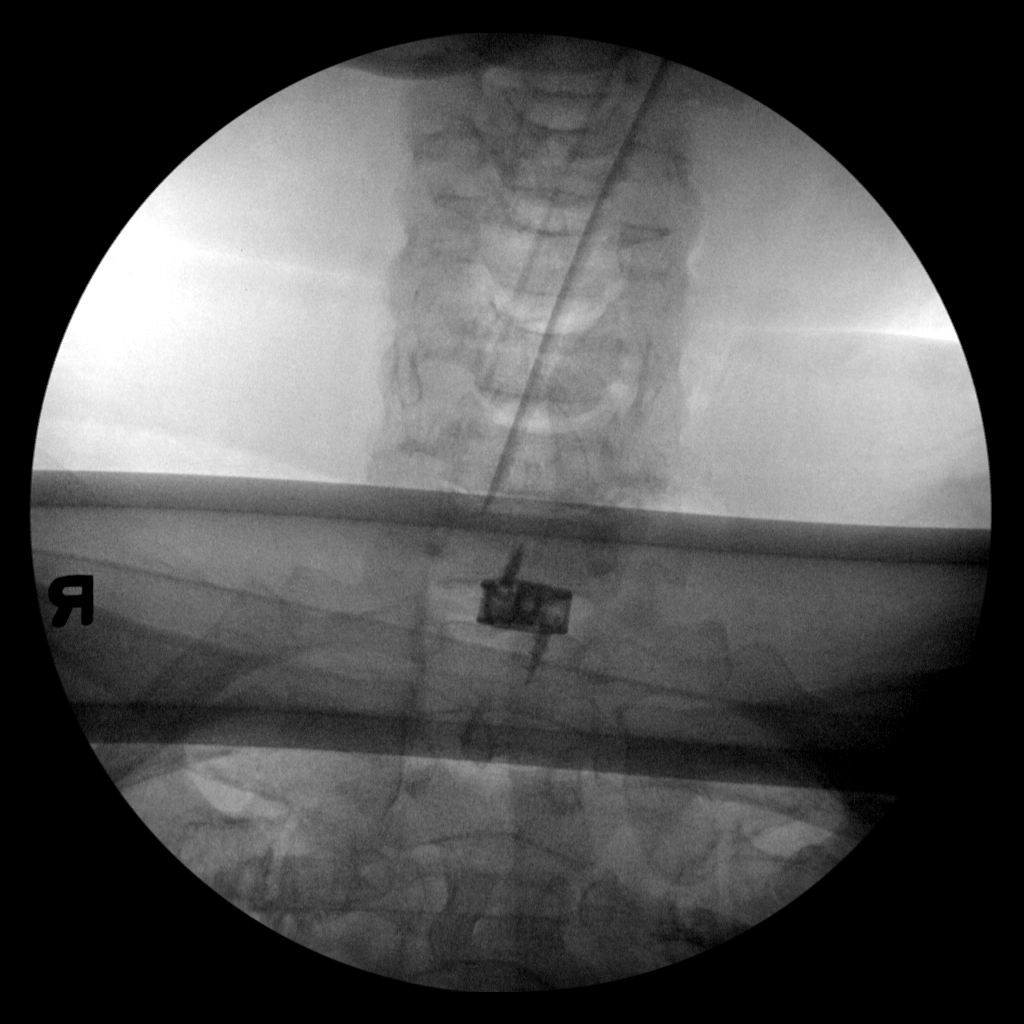

[3 of 3 positions shown; findings below may reference images not displayed]

FINDINGS: Three spot fluoro images are submitted after the procedure. Small
field-of-view and lack of preoperative imaging makes assessment of
particular levels difficult. By report, the patient is status post
surgery C7-T1. On the lateral film, the inferior endplate of the
incompletely visualized cranial most vertebral body does appear to
represent C2. As such, this would place the interbody fusion device
at C7-T1. Loss of disc height is seen at C6-7. Frontal projection is
also most compatible with the interbody fusion device being at the
C7-T1 level.
IMPRESSION: Status post ACDF at C7-T1. No evidence for immediate hardware
complications.

## 2017-04-04 DIAGNOSIS — M1711 Unilateral primary osteoarthritis, right knee: Secondary | ICD-10-CM | POA: Diagnosis not present

## 2017-04-04 DIAGNOSIS — M1712 Unilateral primary osteoarthritis, left knee: Secondary | ICD-10-CM | POA: Diagnosis not present

## 2017-04-04 DIAGNOSIS — M170 Bilateral primary osteoarthritis of knee: Secondary | ICD-10-CM | POA: Diagnosis not present

## 2017-04-06 ENCOUNTER — Ambulatory Visit: Payer: Self-pay | Admitting: Orthopedic Surgery

## 2017-04-16 DIAGNOSIS — M25511 Pain in right shoulder: Secondary | ICD-10-CM | POA: Diagnosis not present

## 2017-04-16 NOTE — H&P (Signed)
TOTAL KNEE ADMISSION H&P  Patient is being admitted for right total knee arthroplasty.  Subjective:  Chief Complaint:right knee pain.  HPI: Vincent Hernandez, 52 y.o. male, has a history of pain and functional disability in the right knee due to arthritis and has failed non-surgical conservative treatments for greater than 12 weeks to includecorticosteriod injections, viscosupplementation injections, supervised PT with diminished ADL's post treatment and weight reduction as appropriate.  Onset of symptoms was gradual, starting 8 years ago with gradually worsening course since that time. The patient noted prior procedures on the knee to include  arthroscopy on the right knee(s).  Patient currently rates pain in the right knee(s) at 7 out of 10 with activity. Patient has worsening of pain with activity and weight bearing, pain that interferes with activities of daily living and joint swelling.  Patient has evidence of subchondral sclerosis, periarticular osteophytes and joint space narrowing by imaging studies. There is no active infection.  Patient Active Problem List   Diagnosis Date Noted  . Neck pain 09/01/2015  . Migraine headache 08/14/2015  . Herniated disc, cervical 08/14/2015  . BMI 40.0-44.9, adult ([place]) 08/14/2015   Past Medical History:  Diagnosis Date  . Arthritis   . GERD (gastroesophageal reflux disease)   . Headache(784.0)    Migraines  . Herniated disc, cervical    through T1  . Neuromuscular disorder [facility])     Past Surgical History:  Procedure Laterality Date  . ANTERIOR CERVICAL DECOMP/DISCECTOMY FUSION N/A 09/01/2015   Procedure: ACDF C7-T1;  Surgeon: Melina Schools, MD;  Location: [facility];  Service: Orthopedics;  Laterality: N/A;  . CARPAL TUNNEL RELEASE Bilateral   . FOOT ARTHROPLASTY Right 2011   screws  . KNEE ARTHROSCOPY Left 1/14  . ORIF TOE FRACTURE Left 11/21/2012   Procedure: OPEN REDUCTION INTERNAL FIXATION (ORIF) LEFT FOURTH METATARSAL NONUNION FRACTURE;   Surgeon: Wylene Simmer, MD;  Location: [facility];  Service: Orthopedics;  Laterality: Left;  . REPAIR QUADRICEPS / HAMSTRING MUSCLE Right   . SEPTOPLASTY  1995  . SHOULDER ARTHROSCOPY W/ ROTATOR CUFF REPAIR Left 2013    No current facility-administered medications for this encounter.    Current Outpatient Medications  Medication Sig Dispense Refill Last Dose  . cyclobenzaprine (FLEXERIL) 10 MG tablet Take 1 tablet (10 mg total) by mouth 3 (three) times daily as needed for muscle spasms. 60 tablet 0 Taking  . ondansetron (ZOFRAN) 4 MG tablet Take 1 tablet (4 mg total) by mouth every 8 (eight) hours as needed for nausea or vomiting. 20 tablet 0 Taking  . oxyCODONE-acetaminophen (PERCOCET) 10-325 MG tablet Take 1 tablet by mouth every 4 (four) hours as needed for pain. 60 tablet 0 Taking  . SUMAtriptan (IMITREX) 50 MG tablet Take 50 mg by mouth every 2 (two) hours as needed for migraine. May repeat in 2 hours if headache persists or recurs.   Taking   Allergies  Allergen Reactions  . Robaxin [Methocarbamol] Itching  . Tdap [Diphth-Acell Pertussis-Tetanus] Other (See Comments)    "Locked up" ?    Social History   Tobacco Use  . Smoking status: Former Research scientist (life sciences)  . Smokeless tobacco: Never Used  . Tobacco comment: smoked for 2-3 years as an early adolescent  Substance Use Topics  . Alcohol use: Yes    Alcohol/week: 0.0 oz    Comment: once in a while-usually brings on migraines    Family History  Problem Relation Age of Onset  . Cancer Father  lung  . Stroke Father 103       1973     Review of Systems  Constitutional: Negative.   HENT: Negative.   Respiratory: Negative.   Cardiovascular: Negative.   Gastrointestinal: Negative.   Genitourinary: Negative.   Musculoskeletal: Positive for joint pain.  Skin: Negative.   Neurological: Negative.   Endo/Heme/Allergies: Negative.   Psychiatric/Behavioral: Negative.     Objective:  Physical Exam  Constitutional:  He is oriented to person, place, and time. He appears well-developed.  HENT:  Head: Normocephalic.  Eyes: EOM are normal.  Cardiovascular: Normal rate and intact distal pulses.  Respiratory: Effort normal.  GI: Soft.  Genitourinary:  Genitourinary Comments: Deferred  Musculoskeletal:  Limited knee rom. Knee is stable. LLE grossly n/v intact.  Neurological: He is alert and oriented to person, place, and time. He has normal reflexes.  Skin: Skin is warm and dry.  Psychiatric: His behavior is normal.    Vital signs in last 24 hours: BP: ()/()  Arterial Line BP: ()/()   Labs:   Estimated body mass index is 41.82 kg/m as calculated from the following:   Height as of 02/07/17: 5\' 9"  (1.753 m).   Weight as of 02/07/17: 128.5 kg (283 lb 3.2 oz).   Imaging Review Plain radiographs demonstrate severe degenerative joint disease of the right knee(s). The overall alignment ismild varus. The bone quality appears to be good for age and reported activity level.  Assessment/Plan:  End stage arthritis, right knee   The patient history, physical examination, clinical judgment of the provider and imaging studies are consistent with end stage degenerative joint disease of the right knee(s) and total knee arthroplasty is deemed medically necessary. The treatment options including medical management, injection therapy arthroscopy and arthroplasty were discussed at length. The risks and benefits of total knee arthroplasty were presented and reviewed. The risks due to aseptic loosening, infection, stiffness, patella tracking problems, thromboembolic complications and other imponderables were discussed. The patient acknowledged the explanation, agreed to proceed with the plan and consent was signed. Patient is being admitted for inpatient treatment for surgery, pain control, PT, OT, prophylactic antibiotics, VTE prophylaxis, progressive ambulation and ADL's and discharge planning. The patient is planning  to be discharged home with home health services    Will use IV tranexamic acid. Contraindications and adverse affects of Tranexamic acid discussed in detail. Patient denies any of these at this time and understands the risks and benefits.

## 2017-04-26 ENCOUNTER — Encounter (HOSPITAL_COMMUNITY): Payer: Self-pay

## 2017-04-26 NOTE — Patient Instructions (Addendum)
Vincent Hernandez  04/26/2017   Your procedure is scheduled on: 05-04-17   Report to [facility] Main  Entrance              Report to admitting at          1045 AM  . Call this number if you have problems the morning of surgery [phone]    Remember: Do not eat food  :After Midnight.  You may have clear liquids until 0700 then nothing by mouth     CLEAR LIQUID DIET   Foods Allowed                                                                     Foods Excluded  Coffee and tea, regular and decaf                             liquids that you cannot  Plain Jell-O in any flavor                                             see through such as: Fruit ices (not with fruit pulp)                                     milk, soups, orange juice  Iced Popsicles                                    All solid food Carbonated beverages, regular and diet                                    Cranberry, grape and apple juices Sports drinks like Gatorade Lightly seasoned clear broth or consume(fat free) Sugar, honey syrup  Sample Menu Breakfast                                Lunch                                     Supper Cranberry juice                    Beef broth                            Chicken broth Jell-O                                     Grape juice  Apple juice Coffee or tea                        Jell-O                                      Popsicle                                                Coffee or tea                        Coffee or tea  _____________________________________________________________________     Take these medicines the morning of surgery with A SIP OF WATER: none              You may not have any metal on your body including hair pins and              piercings  Do not wear jewelry,lotions, powders or perfumes, deodorant                      Men may shave face and neck.   Do not bring valuables to the  hospital. [place].  Contacts, dentures or bridgework may not be worn into surgery.  Leave suitcase in the car. After surgery it may be brought to your room.               Please read over the following fact sheets you were given: _____________________________________________________________________           [facility] - Preparing for Surgery Before surgery, you can play an important role.  Because skin is not sterile, your skin needs to be as free of germs as possible.  You can reduce the number of germs on your skin by washing with CHG (chlorahexidine gluconate) soap before surgery.  CHG is an antiseptic cleaner which kills germs and bonds with the skin to continue killing germs even after washing. Please DO NOT use if you have an allergy to CHG or antibacterial soaps.  If your skin becomes reddened/irritated stop using the CHG and inform your nurse when you arrive at Short Stay. Do not shave (including legs and underarms) for at least 48 hours prior to the first CHG shower.  You may shave your face/neck. Please follow these instructions carefully:  1.  Shower with CHG Soap the night before surgery and the  morning of Surgery.  2.  If you choose to wash your hair, wash your hair first as usual with your  normal  shampoo.  3.  After you shampoo, rinse your hair and body thoroughly to remove the  shampoo.                           4.  Use CHG as you would any other liquid soap.  You can apply chg directly  to the skin and wash                       Gently with a scrungie or clean washcloth.  5.  Apply the CHG Soap to your body ONLY FROM THE NECK DOWN.   Do not use on face/ open                           Wound or open sores. Avoid contact with eyes, ears mouth and genitals (private parts).                       Wash face,  Genitals (private parts) with your normal soap.             6.  Wash thoroughly, paying special attention to the area where  your surgery  will be performed.  7.  Thoroughly rinse your body with warm water from the neck down.  8.  DO NOT shower/wash with your normal soap after using and rinsing off  the CHG Soap.                9.  Pat yourself dry with a clean towel.            10.  Wear clean pajamas.            11.  Place clean sheets on your bed the night of your first shower and do not  sleep with pets. Day of Surgery : Do not apply any lotions/deodorants the morning of surgery.  Please wear clean clothes to the hospital/surgery center.  FAILURE TO FOLLOW THESE INSTRUCTIONS MAY RESULT IN THE CANCELLATION OF YOUR SURGERY PATIENT SIGNATURE_________________________________  NURSE SIGNATURE__________________________________  ________________________________________________________________________  WHAT IS A BLOOD TRANSFUSION? Blood Transfusion Information  A transfusion is the replacement of blood or some of its parts. Blood is made up of multiple cells which provide different functions.  Red blood cells carry oxygen and are used for blood loss replacement.  White blood cells fight against infection.  Platelets control bleeding.  Plasma helps clot blood.  Other blood products are available for specialized needs, such as hemophilia or other clotting disorders. BEFORE THE TRANSFUSION  Who gives blood for transfusions?   Healthy volunteers who are fully evaluated to make sure their blood is safe. This is blood bank blood. Transfusion therapy is the safest it has ever been in the practice of medicine. Before blood is taken from a donor, a complete history is taken to make sure that person has no history of diseases nor engages in risky social behavior (examples are intravenous drug use or sexual activity with multiple partners). The donor's travel history is screened to minimize risk of transmitting infections, such as malaria. The donated blood is tested for signs of infectious diseases, such as HIV and  hepatitis. The blood is then tested to be sure it is compatible with you in order to minimize the chance of a transfusion reaction. If you or a relative donates blood, this is often done in anticipation of surgery and is not appropriate for emergency situations. It takes many days to process the donated blood. RISKS AND COMPLICATIONS Although transfusion therapy is very safe and saves many lives, the main dangers of transfusion include:   Getting an infectious disease.  Developing a transfusion reaction. This is an allergic reaction to something in the blood you were given. Every precaution is taken to prevent this. The decision to have a blood transfusion has been considered carefully by your caregiver before blood is given. Blood is not given unless the benefits outweigh the risks. AFTER THE TRANSFUSION  Right  after receiving a blood transfusion, you will usually feel much better and more energetic. This is especially true if your red blood cells have gotten low (anemic). The transfusion raises the level of the red blood cells which carry oxygen, and this usually causes an energy increase.  The nurse administering the transfusion will monitor you carefully for complications. HOME CARE INSTRUCTIONS  No special instructions are needed after a transfusion. You may find your energy is better. Speak with your caregiver about any limitations on activity for underlying diseases you may have. SEEK MEDICAL CARE IF:   Your condition is not improving after your transfusion.  You develop redness or irritation at the intravenous (IV) site. SEEK IMMEDIATE MEDICAL CARE IF:  Any of the following symptoms occur over the next 12 hours:  Shaking chills.  You have a temperature by mouth above 102 F (38.9 C), not controlled by medicine.  Chest, back, or muscle pain.  People around you feel you are not acting correctly or are confused.  Shortness of breath or difficulty breathing.  Dizziness and  fainting.  You get a rash or develop hives.  You have a decrease in urine output.  Your urine turns a dark color or changes to pink, red, or brown. Any of the following symptoms occur over the next 10 days:  You have a temperature by mouth above 102 F (38.9 C), not controlled by medicine.  Shortness of breath.  Weakness after normal activity.  The white part of the eye turns yellow (jaundice).  You have a decrease in the amount of urine or are urinating less often.  Your urine turns a dark color or changes to pink, red, or brown. Document Released: 02/11/2000 Document Revised: 05/08/2011 Document Reviewed: 09/30/2007 ExitCare Patient Information 2014 [place].  _______________________________________________________________________  Incentive Spirometer  An incentive spirometer is a tool that can help keep your lungs clear and active. This tool measures how well you are filling your lungs with each breath. Taking long deep breaths may help reverse or decrease the chance of developing breathing (pulmonary) problems (especially infection) following:  A long period of time when you are unable to move or be active. BEFORE THE PROCEDURE   If the spirometer includes an indicator to show your best effort, your nurse or respiratory therapist will set it to a desired goal.  If possible, sit up straight or lean slightly forward. Try not to slouch.  Hold the incentive spirometer in an upright position. INSTRUCTIONS FOR USE  1. Sit on the edge of your bed if possible, or sit up as far as you can in bed or on a chair. 2. Hold the incentive spirometer in an upright position. 3. Breathe out normally. 4. Place the mouthpiece in your mouth and seal your lips tightly around it. 5. Breathe in slowly and as deeply as possible, raising the piston or the ball toward the top of the column. 6. Hold your breath for 3-5 seconds or for as long as possible. Allow the piston or ball to fall to  the bottom of the column. 7. Remove the mouthpiece from your mouth and breathe out normally. 8. Rest for a few seconds and repeat Steps 1 through 7 at least 10 times every 1-2 hours when you are awake. Take your time and take a few normal breaths between deep breaths. 9. The spirometer may include an indicator to show your best effort. Use the indicator as a goal to work toward during each repetition. 10. After each  set of 10 deep breaths, practice coughing to be sure your lungs are clear. If you have an incision (the cut made at the time of surgery), support your incision when coughing by placing a pillow or rolled up towels firmly against it. Once you are able to get out of bed, walk around indoors and cough well. You may stop using the incentive spirometer when instructed by your caregiver.  RISKS AND COMPLICATIONS  Take your time so you do not get dizzy or light-headed.  If you are in pain, you may need to take or ask for pain medication before doing incentive spirometry. It is harder to take a deep breath if you are having pain. AFTER USE  Rest and breathe slowly and easily.  It can be helpful to keep track of a log of your progress. Your caregiver can provide you with a simple table to help with this. If you are using the spirometer at home, follow these instructions: [place] IF:   You are having difficultly using the spirometer.  You have trouble using the spirometer as often as instructed.  Your pain medication is not giving enough relief while using the spirometer.  You develop fever of 100.5 F (38.1 C) or higher. SEEK IMMEDIATE MEDICAL CARE IF:   You cough up bloody sputum that had not been present before.  You develop fever of 102 F (38.9 C) or greater.  You develop worsening pain at or near the incision site. MAKE SURE YOU:   Understand these instructions.  Will watch your condition.  Will get help right away if you are not doing well or get  worse. Document Released: 06/26/2006 Document Revised: 05/08/2011 Document Reviewed: 08/27/2006 [facility] Patient Information 2014 [place], [place].   ________________________________________________________________________

## 2017-04-27 ENCOUNTER — Other Ambulatory Visit: Payer: Self-pay

## 2017-04-27 ENCOUNTER — Encounter (HOSPITAL_COMMUNITY): Payer: Self-pay

## 2017-04-27 ENCOUNTER — Encounter (HOSPITAL_COMMUNITY)
Admission: RE | Admit: 2017-04-27 | Discharge: 2017-04-27 | Disposition: A | Discharge status: Home / Self Care | Payer: BLUE CROSS/BLUE SHIELD | Source: Ambulatory Visit | Attending: Specialist | Admitting: Specialist

## 2017-04-27 DIAGNOSIS — Z01812 Encounter for preprocedural laboratory examination: Secondary | ICD-10-CM | POA: Diagnosis not present

## 2017-04-27 DIAGNOSIS — M1711 Unilateral primary osteoarthritis, right knee: Secondary | ICD-10-CM | POA: Diagnosis not present

## 2017-04-27 LAB — URINALYSIS, ROUTINE W REFLEX MICROSCOPIC
BILIRUBIN URINE: NEGATIVE
Glucose, UA: NEGATIVE mg/dL
Hgb urine dipstick: NEGATIVE
KETONES UR: NEGATIVE mg/dL
LEUKOCYTES UA: NEGATIVE
NITRITE: NEGATIVE
PH: 5 (ref 5.0–8.0)
PROTEIN: NEGATIVE mg/dL
Specific Gravity, Urine: 1.024 (ref 1.005–1.030)

## 2017-04-27 LAB — APTT: aPTT: 33 seconds (ref 24–36)

## 2017-04-27 LAB — CBC
HEMATOCRIT: 41.8 % (ref 39.0–52.0)
HEMOGLOBIN: 14.9 g/dL (ref 13.0–17.0)
MCH: 33 pg (ref 26.0–34.0)
MCHC: 35.6 g/dL (ref 30.0–36.0)
MCV: 92.7 fL (ref 78.0–100.0)
Platelets: 284 10*3/uL (ref 150–400)
RBC: 4.51 MIL/uL (ref 4.22–5.81)
RDW: 13.2 % (ref 11.5–15.5)
WBC: 8.5 10*3/uL (ref 4.0–10.5)

## 2017-04-27 LAB — BASIC METABOLIC PANEL
ANION GAP: 8 (ref 5–15)
BUN: 22 mg/dL — ABNORMAL HIGH (ref 6–20)
CHLORIDE: 103 mmol/L (ref 101–111)
CO2: 29 mmol/L (ref 22–32)
Calcium: 9.1 mg/dL (ref 8.9–10.3)
Creatinine, Ser: 0.77 mg/dL (ref 0.61–1.24)
GFR calc non Af Amer: >60 mL/min (ref >60)
GLUCOSE: 88 mg/dL (ref 65–99)
POTASSIUM: 4.3 mmol/L (ref 3.5–5.1)
Sodium: 140 mmol/L (ref 135–145)

## 2017-04-27 LAB — PROTIME-INR
INR: 1
PROTHROMBIN TIME: 13.1 s (ref 11.4–15.2)

## 2017-04-27 LAB — SURGICAL PCR SCREEN
MRSA, PCR: NEGATIVE
STAPHYLOCOCCUS AUREUS: POSITIVE — AB

## 2017-04-27 LAB — ABO/RH: ABO/RH(D): A POS

## 2017-04-27 NOTE — Progress Notes (Signed)
Clearance Harrison Mons PA-C 02-07-17 chart  ekg 02-07-17 epic

## 2017-05-03 MED ORDER — TRANEXAMIC ACID 1000 MG/10ML IV SOLN
1000.0000 mg | INTRAVENOUS | Status: AC
  Administered 2017-05-04: 1000 mg via INTRAVENOUS
  Filled 2017-05-03: qty 1100

## 2017-05-04 ENCOUNTER — Inpatient Hospital Stay (HOSPITAL_COMMUNITY): Payer: BLUE CROSS/BLUE SHIELD | Admitting: Certified Registered"

## 2017-05-04 ENCOUNTER — Other Ambulatory Visit: Payer: Self-pay

## 2017-05-04 ENCOUNTER — Encounter (HOSPITAL_COMMUNITY)
Admission: RE | Disposition: A | Discharge status: Home / Self Care | Payer: Self-pay | Source: Ambulatory Visit | Attending: Specialist

## 2017-05-04 ENCOUNTER — Encounter (HOSPITAL_COMMUNITY): Payer: Self-pay | Admitting: Certified Registered"

## 2017-05-04 ENCOUNTER — Inpatient Hospital Stay (HOSPITAL_COMMUNITY)
Admission: RE | Admit: 2017-05-04 | Discharge: 2017-05-05 | DRG: 470 | Disposition: A | Discharge status: Home / Self Care | Payer: BLUE CROSS/BLUE SHIELD | Source: Ambulatory Visit | Attending: Specialist | Admitting: Specialist

## 2017-05-04 DIAGNOSIS — Z96659 Presence of unspecified artificial knee joint: Secondary | ICD-10-CM

## 2017-05-04 DIAGNOSIS — Z6841 Body Mass Index (BMI) 40.0 and over, adult: Secondary | ICD-10-CM

## 2017-05-04 DIAGNOSIS — Z981 Arthrodesis status: Secondary | ICD-10-CM

## 2017-05-04 DIAGNOSIS — Z887 Allergy status to serum and vaccine status: Secondary | ICD-10-CM | POA: Diagnosis not present

## 2017-05-04 DIAGNOSIS — G8918 Other acute postprocedural pain: Secondary | ICD-10-CM | POA: Diagnosis not present

## 2017-05-04 DIAGNOSIS — Z87891 Personal history of nicotine dependence: Secondary | ICD-10-CM

## 2017-05-04 DIAGNOSIS — Z888 Allergy status to other drugs, medicaments and biological substances status: Secondary | ICD-10-CM | POA: Diagnosis not present

## 2017-05-04 DIAGNOSIS — M1711 Unilateral primary osteoarthritis, right knee: Principal | ICD-10-CM

## 2017-05-04 DIAGNOSIS — Z801 Family history of malignant neoplasm of trachea, bronchus and lung: Secondary | ICD-10-CM | POA: Diagnosis not present

## 2017-05-04 DIAGNOSIS — M5030 Other cervical disc degeneration, unspecified cervical region: Secondary | ICD-10-CM | POA: Diagnosis not present

## 2017-05-04 HISTORY — PX: TOTAL KNEE ARTHROPLASTY: SHX125

## 2017-05-04 LAB — TYPE AND SCREEN
ABO/RH(D): A POS
Antibody Screen: NEGATIVE

## 2017-05-04 SURGERY — ARTHROPLASTY, KNEE, TOTAL
Anesthesia: Spinal | Site: Knee | Laterality: Right

## 2017-05-04 MED ORDER — HYDROMORPHONE HCL 1 MG/ML IJ SOLN
INTRAMUSCULAR | Status: AC
  Administered 2017-05-04: 0.5 mg via INTRAVENOUS
  Filled 2017-05-04: qty 1

## 2017-05-04 MED ORDER — SODIUM CHLORIDE 0.9 % IR SOLN
Status: DC | PRN
  Administered 2017-05-04: 1000 mL

## 2017-05-04 MED ORDER — ONDANSETRON HCL 4 MG/2ML IJ SOLN
INTRAMUSCULAR | Status: DC | PRN
  Administered 2017-05-04: 4 mg via INTRAVENOUS

## 2017-05-04 MED ORDER — BUPIVACAINE IN DEXTROSE 0.75-8.25 % IT SOLN
INTRATHECAL | Status: DC | PRN
  Administered 2017-05-04: 2 mL via INTRATHECAL

## 2017-05-04 MED ORDER — BACLOFEN 10 MG PO TABS
10.0000 mg | ORAL_TABLET | Freq: Four times a day (QID) | ORAL | Status: DC | PRN
  Administered 2017-05-04: 10 mg via ORAL
  Filled 2017-05-04: qty 1

## 2017-05-04 MED ORDER — FERROUS SULFATE 325 (65 FE) MG PO TABS
325.0000 mg | ORAL_TABLET | Freq: Three times a day (TID) | ORAL | Status: DC
  Administered 2017-05-04 – 2017-05-05 (×2): 325 mg via ORAL
  Filled 2017-05-04 (×3): qty 1

## 2017-05-04 MED ORDER — METOCLOPRAMIDE HCL 5 MG/ML IJ SOLN: 5.0000 mg | Freq: Three times a day (TID) | INTRAMUSCULAR | Status: DC | PRN

## 2017-05-04 MED ORDER — KETOROLAC TROMETHAMINE 30 MG/ML IJ SOLN
INTRAMUSCULAR | Status: AC
  Administered 2017-05-04: 30 mg via INTRAVENOUS
  Filled 2017-05-04: qty 1

## 2017-05-04 MED ORDER — PROMETHAZINE HCL 25 MG/ML IJ SOLN: 6.2500 mg | INTRAMUSCULAR | Status: DC | PRN

## 2017-05-04 MED ORDER — BUPIVACAINE HCL (PF) 0.25 % IJ SOLN
INTRAMUSCULAR | Status: AC
  Filled 2017-05-04: qty 30

## 2017-05-04 MED ORDER — CEFAZOLIN SODIUM-DEXTROSE 2-4 GM/100ML-% IV SOLN
2.0000 g | Freq: Four times a day (QID) | INTRAVENOUS | Status: AC
  Administered 2017-05-04 – 2017-05-05 (×2): 2 g via INTRAVENOUS
  Filled 2017-05-04 (×2): qty 100

## 2017-05-04 MED ORDER — PHENOL 1.4 % MT LIQD: 1.0000 | OROMUCOSAL | Status: DC | PRN

## 2017-05-04 MED ORDER — OXYCODONE HCL 5 MG PO TABS: 5.0000 mg | ORAL_TABLET | ORAL | 0 refills | Status: DC | PRN

## 2017-05-04 MED ORDER — PROPOFOL 500 MG/50ML IV EMUL
INTRAVENOUS | Status: DC | PRN
  Administered 2017-05-04: 100 ug/kg/min via INTRAVENOUS

## 2017-05-04 MED ORDER — TEMAZEPAM 15 MG PO CAPS: 15.0000 mg | ORAL_CAPSULE | Freq: Every evening | ORAL | Status: DC | PRN

## 2017-05-04 MED ORDER — MENTHOL 3 MG MT LOZG: 1.0000 | LOZENGE | OROMUCOSAL | Status: DC | PRN

## 2017-05-04 MED ORDER — ONDANSETRON HCL 4 MG/2ML IJ SOLN
INTRAMUSCULAR | Status: AC
  Filled 2017-05-04: qty 2

## 2017-05-04 MED ORDER — OXYCODONE HCL ER 10 MG PO T12A
10.0000 mg | EXTENDED_RELEASE_TABLET | Freq: Two times a day (BID) | ORAL | Status: DC
  Administered 2017-05-04: 10 mg via ORAL
  Filled 2017-05-04: qty 1

## 2017-05-04 MED ORDER — MIDAZOLAM HCL 2 MG/2ML IJ SOLN
1.0000 mg | INTRAMUSCULAR | Status: DC
  Administered 2017-05-04: 2 mg via INTRAVENOUS
  Filled 2017-05-04: qty 2

## 2017-05-04 MED ORDER — HYDROMORPHONE HCL 1 MG/ML IJ SOLN: 0.5000 mg | INTRAMUSCULAR | Status: DC | PRN

## 2017-05-04 MED ORDER — ONDANSETRON HCL 4 MG/2ML IJ SOLN: 4.0000 mg | Freq: Four times a day (QID) | INTRAMUSCULAR | Status: DC | PRN

## 2017-05-04 MED ORDER — PROPOFOL 10 MG/ML IV BOLUS
INTRAVENOUS | Status: AC
  Filled 2017-05-04: qty 40

## 2017-05-04 MED ORDER — METOCLOPRAMIDE HCL 5 MG PO TABS: 5.0000 mg | ORAL_TABLET | Freq: Three times a day (TID) | ORAL | Status: DC | PRN

## 2017-05-04 MED ORDER — LACTATED RINGERS IV SOLN
INTRAVENOUS | Status: DC
  Administered 2017-05-04: 17:00:00 via INTRAVENOUS

## 2017-05-04 MED ORDER — CEFAZOLIN SODIUM-DEXTROSE 2-4 GM/100ML-% IV SOLN
2.0000 g | INTRAVENOUS | Status: AC
  Administered 2017-05-04: 2 g via INTRAVENOUS
  Filled 2017-05-04: qty 100

## 2017-05-04 MED ORDER — KETOROLAC TROMETHAMINE 30 MG/ML IJ SOLN
INTRAMUSCULAR | Status: AC
  Filled 2017-05-04: qty 1

## 2017-05-04 MED ORDER — SODIUM CHLORIDE 0.9 % IJ SOLN
INTRAMUSCULAR | Status: AC
  Filled 2017-05-04: qty 50

## 2017-05-04 MED ORDER — MAGNESIUM CITRATE PO SOLN: 1.0000 | Freq: Once | ORAL | Status: DC | PRN

## 2017-05-04 MED ORDER — ENOXAPARIN SODIUM 30 MG/0.3ML ~~LOC~~ SOLN
30.0000 mg | Freq: Two times a day (BID) | SUBCUTANEOUS | Status: DC
  Administered 2017-05-05: 30 mg via SUBCUTANEOUS
  Filled 2017-05-04: qty 0.3

## 2017-05-04 MED ORDER — MIDAZOLAM HCL 5 MG/5ML IJ SOLN
INTRAMUSCULAR | Status: DC | PRN
  Administered 2017-05-04: 2 mg via INTRAVENOUS

## 2017-05-04 MED ORDER — OXYCODONE HCL 5 MG PO TABS
10.0000 mg | ORAL_TABLET | ORAL | Status: DC | PRN
  Administered 2017-05-05 (×2): 10 mg via ORAL
  Filled 2017-05-04: qty 2

## 2017-05-04 MED ORDER — MEPERIDINE HCL 50 MG/ML IJ SOLN: 6.2500 mg | INTRAMUSCULAR | Status: DC | PRN

## 2017-05-04 MED ORDER — LIDOCAINE 2% (20 MG/ML) 5 ML SYRINGE
INTRAMUSCULAR | Status: DC | PRN
  Administered 2017-05-04: 100 mg via INTRAVENOUS

## 2017-05-04 MED ORDER — METHOCARBAMOL 500 MG PO TABS: 500.0000 mg | ORAL_TABLET | Freq: Four times a day (QID) | ORAL | Status: DC | PRN

## 2017-05-04 MED ORDER — BACLOFEN 10 MG PO TABS: 10.0000 mg | ORAL_TABLET | Freq: Four times a day (QID) | ORAL | 1 refills | Status: DC | PRN

## 2017-05-04 MED ORDER — BISACODYL 5 MG PO TBEC: 5.0000 mg | DELAYED_RELEASE_TABLET | Freq: Every day | ORAL | Status: DC | PRN

## 2017-05-04 MED ORDER — METHOCARBAMOL 1000 MG/10ML IJ SOLN: 500.0000 mg | Freq: Four times a day (QID) | INTRAVENOUS | Status: DC | PRN

## 2017-05-04 MED ORDER — FENTANYL CITRATE (PF) 100 MCG/2ML IJ SOLN
50.0000 ug | INTRAMUSCULAR | Status: DC
Start: 2017-05-04 — End: 2017-05-04
  Administered 2017-05-04: 100 ug via INTRAVENOUS
  Filled 2017-05-04: qty 2

## 2017-05-04 MED ORDER — LIDOCAINE 2% (20 MG/ML) 5 ML SYRINGE
INTRAMUSCULAR | Status: AC
  Filled 2017-05-04: qty 5

## 2017-05-04 MED ORDER — 0.9 % SODIUM CHLORIDE (POUR BTL) OPTIME
TOPICAL | Status: DC | PRN
  Administered 2017-05-04: 1000 mL

## 2017-05-04 MED ORDER — KETOROLAC TROMETHAMINE 30 MG/ML IJ SOLN
30.0000 mg | Freq: Once | INTRAMUSCULAR | Status: DC | PRN
  Administered 2017-05-04: 30 mg via INTRAVENOUS

## 2017-05-04 MED ORDER — DEXAMETHASONE SODIUM PHOSPHATE 10 MG/ML IJ SOLN
10.0000 mg | Freq: Once | INTRAMUSCULAR | Status: AC
  Administered 2017-05-04: 10 mg via INTRAVENOUS

## 2017-05-04 MED ORDER — SODIUM CHLORIDE 0.9 % IJ SOLN
INTRAMUSCULAR | Status: DC | PRN
Start: 2017-05-04 — End: 2017-05-04
  Administered 2017-05-04: 29 mL

## 2017-05-04 MED ORDER — PHENYLEPHRINE 40 MCG/ML (10ML) SYRINGE FOR IV PUSH (FOR BLOOD PRESSURE SUPPORT)
PREFILLED_SYRINGE | INTRAVENOUS | Status: AC
  Filled 2017-05-04: qty 10

## 2017-05-04 MED ORDER — DOCUSATE SODIUM 100 MG PO CAPS
100.0000 mg | ORAL_CAPSULE | Freq: Two times a day (BID) | ORAL | Status: DC
  Administered 2017-05-04 – 2017-05-05 (×2): 100 mg via ORAL
  Filled 2017-05-04 (×2): qty 1

## 2017-05-04 MED ORDER — ACETAMINOPHEN 325 MG PO TABS: 325.0000 mg | ORAL_TABLET | Freq: Four times a day (QID) | ORAL | Status: DC | PRN

## 2017-05-04 MED ORDER — DIPHENHYDRAMINE HCL 12.5 MG/5ML PO ELIX: 12.5000 mg | ORAL_SOLUTION | ORAL | Status: DC | PRN

## 2017-05-04 MED ORDER — BUPIVACAINE HCL (PF) 0.25 % IJ SOLN
INTRAMUSCULAR | Status: DC | PRN
  Administered 2017-05-04: 30 mL

## 2017-05-04 MED ORDER — PROPOFOL 10 MG/ML IV BOLUS
INTRAVENOUS | Status: AC
  Filled 2017-05-04: qty 20

## 2017-05-04 MED ORDER — ONDANSETRON HCL 4 MG PO TABS: 4.0000 mg | ORAL_TABLET | Freq: Four times a day (QID) | ORAL | Status: DC | PRN

## 2017-05-04 MED ORDER — STERILE WATER FOR IRRIGATION IR SOLN
Status: DC | PRN
  Administered 2017-05-04: 2000 mL

## 2017-05-04 MED ORDER — MIDAZOLAM HCL 2 MG/2ML IJ SOLN
INTRAMUSCULAR | Status: AC
  Filled 2017-05-04: qty 2

## 2017-05-04 MED ORDER — POLYETHYLENE GLYCOL 3350 17 G PO PACK: 17.0000 g | PACK | Freq: Every day | ORAL | Status: DC | PRN

## 2017-05-04 MED ORDER — SODIUM CHLORIDE 0.9 % IV SOLN
INTRAVENOUS | Status: DC
  Administered 2017-05-04 – 2017-05-05 (×2): via INTRAVENOUS

## 2017-05-04 MED ORDER — CHLORHEXIDINE GLUCONATE 4 % EX LIQD: 60.0000 mL | Freq: Once | CUTANEOUS | Status: DC

## 2017-05-04 MED ORDER — OXYCODONE HCL 5 MG PO TABS
5.0000 mg | ORAL_TABLET | ORAL | Status: DC | PRN
  Administered 2017-05-04: 10 mg via ORAL
  Filled 2017-05-04 (×2): qty 2

## 2017-05-04 MED ORDER — DEXAMETHASONE SODIUM PHOSPHATE 10 MG/ML IJ SOLN
INTRAMUSCULAR | Status: AC
  Filled 2017-05-04: qty 1

## 2017-05-04 MED ORDER — ALUM & MAG HYDROXIDE-SIMETH 200-200-20 MG/5ML PO SUSP: 30.0000 mL | ORAL | Status: DC | PRN

## 2017-05-04 MED ORDER — KETOROLAC TROMETHAMINE 30 MG/ML IJ SOLN
INTRAMUSCULAR | Status: DC | PRN
Start: 2017-05-04 — End: 2017-05-04
  Administered 2017-05-04: 30 mg

## 2017-05-04 MED ORDER — LACTATED RINGERS IV SOLN
INTRAVENOUS | Status: DC
  Administered 2017-05-04 (×2): via INTRAVENOUS

## 2017-05-04 MED ORDER — ASPIRIN EC 325 MG PO TBEC: 325.0000 mg | DELAYED_RELEASE_TABLET | Freq: Two times a day (BID) | ORAL | 0 refills | Status: DC

## 2017-05-04 MED ORDER — HYDROMORPHONE HCL 1 MG/ML IJ SOLN
0.2500 mg | INTRAMUSCULAR | Status: DC | PRN
  Administered 2017-05-04 (×3): 0.5 mg via INTRAVENOUS

## 2017-05-04 SURGICAL SUPPLY — 63 items
BAG ZIPLOCK 12X15 (MISCELLANEOUS) ×4 IMPLANT
BANDAGE ACE 4X5 VEL STRL LF (GAUZE/BANDAGES/DRESSINGS) ×2 IMPLANT
BANDAGE ACE 6X5 VEL STRL LF (GAUZE/BANDAGES/DRESSINGS) ×2 IMPLANT
BLADE SAG 18X100X1.27 (BLADE) ×2 IMPLANT
BLADE SAW SGTL 13.0X1.19X90.0M (BLADE) ×2 IMPLANT
BOWL SMART MIX CTS (DISPOSABLE) ×2 IMPLANT
CAP KNEE TOTAL 3 SIGMA ×2 IMPLANT
CEMENT HV SMART SET (Cement) ×4 IMPLANT
COVER SURGICAL LIGHT HANDLE (MISCELLANEOUS) ×2 IMPLANT
CUFF TOURN SGL QUICK 34 (TOURNIQUET CUFF) ×1
CUFF TRNQT CYL 34X4X40X1 (TOURNIQUET CUFF) ×1 IMPLANT
DECANTER SPIKE VIAL GLASS SM (MISCELLANEOUS) ×2 IMPLANT
DERMABOND ADVANCED (GAUZE/BANDAGES/DRESSINGS) ×1
DERMABOND ADVANCED .7 DNX12 (GAUZE/BANDAGES/DRESSINGS) ×1 IMPLANT
DRAPE TOP 10253 STERILE (DRAPES) ×2 IMPLANT
DRAPE U-SHAPE 47X51 STRL (DRAPES) ×2 IMPLANT
DRESSING AQUACEL AG SP 3.5X10 (GAUZE/BANDAGES/DRESSINGS) ×1 IMPLANT
DRSG AQUACEL AG SP 3.5X10 (GAUZE/BANDAGES/DRESSINGS) ×2
DRSG TEGADERM 4X4.75 (GAUZE/BANDAGES/DRESSINGS) ×2 IMPLANT
DURAPREP 26ML APPLICATOR (WOUND CARE) ×4 IMPLANT
ELECT REM PT RETURN 15FT ADLT (MISCELLANEOUS) ×2 IMPLANT
EVACUATOR 1/8 PVC DRAIN (DRAIN) ×2 IMPLANT
GAUZE SPONGE 2X2 8PLY STRL LF (GAUZE/BANDAGES/DRESSINGS) ×1 IMPLANT
GLOVE BIO SURGEON STRL SZ7.5 (GLOVE) ×4 IMPLANT
GLOVE BIO SURGEON STRL SZ8.5 (GLOVE) ×2 IMPLANT
GLOVE BIOGEL PI IND STRL 6.5 (GLOVE) ×1 IMPLANT
GLOVE BIOGEL PI IND STRL 7.5 (GLOVE) ×3 IMPLANT
GLOVE BIOGEL PI IND STRL 8 (GLOVE) ×2 IMPLANT
GLOVE BIOGEL PI INDICATOR 6.5 (GLOVE) ×1
GLOVE BIOGEL PI INDICATOR 7.5 (GLOVE) ×3
GLOVE BIOGEL PI INDICATOR 8 (GLOVE) ×2
GLOVE ECLIPSE 7.5 STRL STRAW (GLOVE) ×2 IMPLANT
GLOVE ECLIPSE 8.0 STRL XLNG CF (GLOVE) ×4 IMPLANT
GLOVE SURG ORTHO 9.0 STRL STRW (GLOVE) ×2 IMPLANT
GLOVE SURG SS PI 7.5 STRL IVOR (GLOVE) ×2 IMPLANT
GOWN STRL REUS W/ TWL XL LVL3 (GOWN DISPOSABLE) ×1 IMPLANT
GOWN STRL REUS W/TWL LRG LVL3 (GOWN DISPOSABLE) ×2 IMPLANT
GOWN STRL REUS W/TWL XL LVL3 (GOWN DISPOSABLE) ×5 IMPLANT
HANDPIECE INTERPULSE COAX TIP (DISPOSABLE) ×1
IMMOBILIZER KNEE 20 (SOFTGOODS) ×2
IMMOBILIZER KNEE 20 THIGH 36 (SOFTGOODS) ×1 IMPLANT
PACK TOTAL KNEE CUSTOM (KITS) ×2 IMPLANT
POSITIONER SURGICAL ARM (MISCELLANEOUS) ×2 IMPLANT
SET HNDPC FAN SPRY TIP SCT (DISPOSABLE) ×1 IMPLANT
SET PAD KNEE POSITIONER (MISCELLANEOUS) ×2 IMPLANT
SPONGE GAUZE 2X2 STER 10/PKG (GAUZE/BANDAGES/DRESSINGS) ×1
SPONGE LAP 18X18 X RAY DECT (DISPOSABLE) ×2 IMPLANT
SPONGE SURGIFOAM ABS GEL 100 (HEMOSTASIS) ×2 IMPLANT
STOCKINETTE 6  STRL (DRAPES) ×1
STOCKINETTE 6 STRL (DRAPES) ×1 IMPLANT
SUCTION FRAZIER HANDLE 12FR (TUBING) ×1
SUCTION TUBE FRAZIER 12FR DISP (TUBING) ×1 IMPLANT
SUT MNCRL AB 3-0 PS2 18 (SUTURE) ×2 IMPLANT
SUT VIC AB 1 CT1 27 (SUTURE) ×4
SUT VIC AB 1 CT1 27XBRD ANTBC (SUTURE) ×4 IMPLANT
SUT VIC AB 2-0 CT1 27 (SUTURE) ×2
SUT VIC AB 2-0 CT1 TAPERPNT 27 (SUTURE) ×2 IMPLANT
SUT VLOC 180 0 24IN GS25 (SUTURE) ×2 IMPLANT
SYR 50ML LL SCALE MARK (SYRINGE) ×2 IMPLANT
TAPE STRIPS DRAPE STRL (GAUZE/BANDAGES/DRESSINGS) ×2 IMPLANT
TRAY FOLEY W/METER SILVER 16FR (SET/KITS/TRAYS/PACK) ×2 IMPLANT
WRAP KNEE MAXI GEL POST OP (GAUZE/BANDAGES/DRESSINGS) ×2 IMPLANT
YANKAUER SUCT BULB TIP 10FT TU (MISCELLANEOUS) ×2 IMPLANT

## 2017-05-04 NOTE — Progress Notes (Signed)
Assisted Dr. Germeroth with right, ultrasound guided, adductor canal block. Side rails up, monitors on throughout procedure. See vital signs in flow sheet. Tolerated Procedure well. 

## 2017-05-04 NOTE — Anesthesia Preprocedure Evaluation (Signed)
Anesthesia Evaluation  Patient identified by MRN, date of birth, ID band Patient awake    Reviewed: Allergy & Precautions, NPO status , Patient's Chart, lab work & pertinent test results  History of Anesthesia Complications Negative for: history of anesthetic complications  Airway Mallampati: III  TM Distance: >3 FB Neck ROM: Full    Dental  (+) Teeth Intact, Dental Advisory Given   Pulmonary former smoker,    Pulmonary exam normal        Cardiovascular negative cardio ROS Normal cardiovascular exam Rhythm:Regular     Neuro/Psych  Headaches, negative psych ROS   GI/Hepatic Neg liver ROS,   Endo/Other  Morbid obesity  Renal/GU negative Renal ROS     Musculoskeletal  (+) Arthritis ,   Abdominal (+) + obese,   Peds  Hematology   Anesthesia Other Findings   Reproductive/Obstetrics                             Anesthesia Physical  Anesthesia Plan  ASA: III  Anesthesia Plan: Spinal   Post-op Pain Management:  Regional for Post-op pain   Induction: Intravenous  PONV Risk Score and Plan: 1 and Ondansetron and Propofol infusion  Airway Management Planned:   Additional Equipment:   Intra-op Plan:   Post-operative Plan:   Informed Consent: I have reviewed the patients History and Physical, chart, labs and discussed the procedure including the risks, benefits and alternatives for the proposed anesthesia with the patient or authorized representative who has indicated his/her understanding and acceptance.   Dental advisory given  Plan Discussed with: CRNA  Anesthesia Plan Comments:         Anesthesia Quick Evaluation                                  Anesthesia Evaluation  Patient identified by MRN, date of birth, ID band Patient awake    Reviewed: Allergy & Precautions, NPO status   Airway Mallampati: I TM Distance: >3 FB Neck ROM: Full    Dental  (+) Teeth  Intact and Dental Advisory Given   Pulmonary  breath sounds clear to auscultation        Cardiovascular Rhythm:Regular Rate:Normal     Neuro/Psych  Headaches,    GI/Hepatic   Endo/Other  Morbid obesity  Renal/GU      Musculoskeletal   Abdominal   Peds  Hematology   Anesthesia Other Findings   Reproductive/Obstetrics                          Anesthesia Physical Anesthesia Plan  ASA: II  Anesthesia Plan: General   Post-op Pain Management:    Induction: Intravenous  Airway Management Planned: LMA  Additional Equipment:   Intra-op Plan:   Post-operative Plan: Extubation in OR  Informed Consent: I have reviewed the patients History and Physical, chart, labs and discussed the procedure including the risks, benefits and alternatives for the proposed anesthesia with the patient or authorized representative who has indicated his/her understanding and acceptance.   Dental advisory given  Plan Discussed with: CRNA, Anesthesiologist and Surgeon  Anesthesia Plan Comments:         Anesthesia Quick Evaluation  Anesthesia Evaluation  Patient identified by MRN, date of birth, ID band Patient awake    Reviewed: Allergy & Precautions, NPO status   Airway Mallampati: I TM Distance: >3 FB Neck ROM: Full    Dental  (+) Teeth Intact and Dental Advisory Given   Pulmonary  breath sounds clear to auscultation        Cardiovascular Rhythm:Regular Rate:Normal     Neuro/Psych  Headaches,    GI/Hepatic   Endo/Other  Morbid obesity  Renal/GU      Musculoskeletal   Abdominal   Peds  Hematology   Anesthesia Other Findings   Reproductive/Obstetrics                          Anesthesia Physical Anesthesia Plan  ASA: II  Anesthesia Plan: General   Post-op Pain Management:    Induction: Intravenous  Airway Management Planned: LMA  Additional  Equipment:   Intra-op Plan:   Post-operative Plan: Extubation in OR  Informed Consent: I have reviewed the patients History and Physical, chart, labs and discussed the procedure including the risks, benefits and alternatives for the proposed anesthesia with the patient or authorized representative who has indicated his/her understanding and acceptance.   Dental advisory given  Plan Discussed with: CRNA, Anesthesiologist and Surgeon  Anesthesia Plan Comments:         Anesthesia Quick Evaluation

## 2017-05-04 NOTE — Anesthesia Postprocedure Evaluation (Signed)
Anesthesia Post Note  Patient: Vincent Hernandez  Procedure(s) Performed: RIGHT TOTAL KNEE ARTHROPLASTY (Right Knee)     Patient location during evaluation: PACU Anesthesia Type: Spinal Level of consciousness: awake and alert Pain management: pain level controlled Vital Signs Assessment: post-procedure vital signs reviewed and stable Respiratory status: spontaneous breathing and respiratory function stable Cardiovascular status: blood pressure returned to baseline and stable Postop Assessment: spinal receding and no apparent nausea or vomiting Anesthetic complications: no Comments: Complaining of increased shoulder pain as compared to preop, surgeon aware.    Last Vitals:  Vitals:   05/04/17 1645 05/04/17 1700  BP:  (!) 153/84  Pulse:    Resp: 17   Temp:    SpO2:      Last Pain:  Vitals:   05/04/17 1623  TempSrc:   PainSc: 0-No pain                 Audry Pili

## 2017-05-04 NOTE — Anesthesia Procedure Notes (Signed)
Spinal  Patient location during procedure: OR End time: 05/04/2017 2:18 PM Staffing Resident/CRNA: Noralyn Pick D, CRNA Performed: anesthesiologist and resident/CRNA  Preanesthetic Checklist Completed: patient identified, site marked, surgical consent, pre-op evaluation, timeout performed, IV checked, risks and benefits discussed and monitors and equipment checked Spinal Block Patient position: sitting Prep: Betadine Patient monitoring: heart rate, continuous pulse ox and blood pressure Approach: midline Location: L3-4 Injection technique: single-shot Needle Needle type: Spinocan  Needle gauge: 24 G Needle length: 9 cm Assessment Sensory level: T6 Additional Notes Expiration date of kit checked and confirmed. Patient tolerated procedure well, without complications.

## 2017-05-04 NOTE — Op Note (Signed)
DATE OF SURGERY:  05/04/2017  TIME: 4:18 PM  PATIENT NAME:  Vincent Hernandez    AGE: 52 y.o.   PRE-OPERATIVE DIAGNOSIS:  Right knee osteoarthritis  POST-OPERATIVE DIAGNOSIS:  Right knee osteoarthritis  PROCEDURE:  Procedure(s): RIGHT TOTAL KNEE ARTHROPLASTY  SURGEON:  Isbella Arline ANDREW  ASSISTANT:  Bryson Stilwell, PA-C, present and scrubbed throughout the case, critical for assistance with exposure, retraction, instrumentation, and closure.  OPERATIVE IMPLANTS: Depuy PFC Sigma Rotating Platform.  Femur size 4, Tibia size 4, Patella size 38 3-peg oval button, with a 10 mm polyethylene insert.   PREOPERATIVE INDICATIONS:   [name] is a 52 y.o. year old male with end stage bone on bone arthritis of the knee who failed conservative treatment and elected for Total Knee Arthroplasty.   The risks, benefits, and alternatives were discussed at length including but not limited to the risks of infection, bleeding, nerve injury, stiffness, blood clots, the need for revision surgery, cardiopulmonary complications, among others, and they were willing to proceed.  OPERATIVE DESCRIPTION:  The patient was brought to the operative room and placed in a supine position.  Spinal anesthesia was administered.  IV antibiotics were given.  The lower extremity was prepped and draped in the usual sterile fashion.  Time out was performed.  The leg was elevated and exsanguinated and the tourniquet was inflated.  Anterior quadriceps tendon splitting approach was performed.  The patella was retracted and osteophytes were removed.  The anterior horn of the medial and lateral meniscus was removed and cruciate ligaments resected.   The distal femur was opened with the drill and the intramedullary distal femoral cutting jig was utilized, set at 5 degrees resecting 10 mm off the distal femur.  Care was taken to protect the collateral ligaments.  The distal femoral sizing jig was applied, taking care to  avoid notching.  Then the 4-in-1 cutting jig was applied and the anterior and posterior femur was cut, along with the chamfer cuts.    Then the extramedullary tibial cutting jig was utilized making the appropriate cut using the anterior tibial crest as a reference building in appropriate posterior slope.  Care was taken during the cut to protect the medial and collateral ligaments.  The proximal tibia was removed along with the posterior horns of the menisci.   The posterior medial femoral osteophytes and posterior lateral femoral osteophytes were removed.    The flexion gap was then measured and was symmetric with the extension gap, measured at 10.  I completed the distal femoral preparation using the appropriate jig to prepare the box.  The patella was then measured, and cut with the saw.    The proximal tibia sized and prepared accordingly with the reamer and the punch, and then all components were trialed with the trial insert.  The knee was found to have excellent balance and full motion.    The above named components were then cemented into place and all excess cement was removed.  The trial polyethylene component was in place during cementation, and then was exchanged for the real polyethylene component.    The knee was easily taken through a range of motion and the patella tracked well and the knee irrigated copiously and the parapatellar and subcutaneous tissue closed with vicryl, and monocryl with steri strips for the skin.  The arthrotomy was closed at 90 of flexion. The wounds were dressed with sterile gauze and the tourniquet released and the patient was awakened and returned to the PACU  in stable and satisfactory condition.  There were no complications.  Total tourniquet time was 65 minutes.

## 2017-05-04 NOTE — Anesthesia Procedure Notes (Signed)
Anesthesia Regional Block: Adductor canal block   Pre-Anesthetic Checklist: ,, timeout performed, Correct Patient, Correct Site, Correct Laterality, Correct Procedure, Correct Position, site marked, Risks and benefits discussed,  Surgical consent,  Pre-op evaluation,  At surgeon's request and post-op pain management  Laterality: Right  Prep: chloraprep       Needles:  Injection technique: Single-shot  Needle Type: Stimiplex     Needle Length: 9cm  Needle Gauge: 21     Additional Needles:   Procedures:,,,, ultrasound used (permanent image in chart),,,,  Narrative:  Start time: 05/04/2017 1:20 PM End time: 05/04/2017 1:24 PM Injection made incrementally with aspirations every 5 mL.  Performed by: Personally  Anesthesiologist: Nolon Nations, MD  Additional Notes: BP cuff, EKG monitors applied. Sedation begun. Artery and nerve location verified with U/S and anesthetic injected incrementally, slowly, and after negative aspirations under direct u/s guidance. Good fascial /perineural spread. Tolerated well.

## 2017-05-04 NOTE — Transfer of Care (Signed)
Immediate Anesthesia Transfer of Care Note  Patient: Vincent Hernandez  Procedure(s) Performed: RIGHT TOTAL KNEE ARTHROPLASTY (Right Knee)  Patient Location: PACU  Anesthesia Type:Regional and Spinal  Level of Consciousness: awake, alert  and oriented  Airway & Oxygen Therapy: Patient Spontanous Breathing  Post-op Assessment: Report given to RN and Post -op Vital signs reviewed and stable  Post vital signs: Reviewed and stable  Last Vitals:  Vitals:   05/04/17 1356 05/04/17 1359  BP:  140/84  Pulse: 73 66  Resp: 18 14  Temp:    SpO2: 99% 97%    Last Pain:  Vitals:   05/04/17 1146  TempSrc:   PainSc: 3       Patients Stated Pain Goal: 4 (06/00/45 9977)  Complications: No apparent anesthesia complications

## 2017-05-04 NOTE — Interval H&P Note (Signed)
History and Physical Interval Note:  05/04/2017 2:05 PM  Vincent Hernandez  has presented today for surgery, with the diagnosis of Right knee osteoarthritis  The various methods of treatment have been discussed with the patient and family. After consideration of risks, benefits and other options for treatment, the patient has consented to  Procedure(s): RIGHT TOTAL KNEE ARTHROPLASTY (Right) as a surgical intervention .  The patient's history has been reviewed, patient examined, no change in status, stable for surgery.  I have reviewed the patient's chart and labs.  Questions were answered to the patient's satisfaction.     [name]

## 2017-05-05 LAB — BASIC METABOLIC PANEL
Anion gap: 5 (ref 5–15)
BUN: 15 mg/dL (ref 6–20)
CO2: 30 mmol/L (ref 22–32)
CREATININE: 0.82 mg/dL (ref 0.61–1.24)
Calcium: 8.8 mg/dL — ABNORMAL LOW (ref 8.9–10.3)
Chloride: 105 mmol/L (ref 101–111)
GFR calc non Af Amer: >60 mL/min (ref >60)
Glucose, Bld: 151 mg/dL — ABNORMAL HIGH (ref 65–99)
POTASSIUM: 4.1 mmol/L (ref 3.5–5.1)
SODIUM: 140 mmol/L (ref 135–145)

## 2017-05-05 LAB — CBC
HCT: 37.4 % — ABNORMAL LOW (ref 39.0–52.0)
HEMOGLOBIN: 12.6 g/dL — AB (ref 13.0–17.0)
MCH: 32.1 pg (ref 26.0–34.0)
MCHC: 33.7 g/dL (ref 30.0–36.0)
MCV: 95.4 fL (ref 78.0–100.0)
PLATELETS: 317 10*3/uL (ref 150–400)
RBC: 3.92 MIL/uL — AB (ref 4.22–5.81)
RDW: 13.6 % (ref 11.5–15.5)
WBC: 15.3 10*3/uL — ABNORMAL HIGH (ref 4.0–10.5)

## 2017-05-05 NOTE — Evaluation (Signed)
Physical Therapy Evaluation Patient Details Name: Vincent Hernandez MRN: [id number] DOB: 07/16/1965 Today's Date: 05/05/2017   History of Present Illness  SCHON ZEIDERS is a 52 y.o. year old male with end stage bone on bone arthritis of the knee who failed conservative treatment and elected for Total Knee Arthroplasty.   Clinical Impression  Pt presents with dependencies in mobility secondary to R TKR. Pt is ambulating on the unit 150 ft with supervision. Pt educated on R knee ROM and strengthening exercises. Pt was able to complete exercises with some minor increased pain. Pt knee flexion 0-90*. Will see pt a second time today to facilitate d/c home. Pt would benefit from continued skilled PT to maximize mobility and Independence for return home.    Follow Up Recommendations Follow surgeon's recommendation for DC plan and follow-up therapies    Equipment Recommendations  None recommended by PT    Recommendations for Other Services       Precautions / Restrictions Precautions Precautions: Knee Restrictions Weight Bearing Restrictions: No Other Position/Activity Restrictions: WBAT      Mobility  Bed Mobility Overal bed mobility: Modified Independent                Transfers Overall transfer level: Modified independent Equipment used: Rolling walker (2 wheeled)                Ambulation/Gait Ambulation/Gait assistance: Supervision Ambulation Distance (Feet): 150 Feet Assistive device: Rolling walker (2 wheeled) Gait Pattern/deviations: Step-through pattern;Decreased weight shift to right Gait velocity: good      Stairs            Wheelchair Mobility    Modified Rankin (Stroke Patients Only)       Balance Overall balance assessment: Needs assistance Sitting-balance support: No upper extremity supported Sitting balance-Leahy Scale: Normal     Standing balance support: Bilateral upper extremity supported Standing balance-Leahy Scale: Fair                                Pertinent Vitals/Pain Pain Assessment: 0-10 Pain Score: 2  Pain Location: R Knee Pain Descriptors / Indicators: Discomfort Pain Intervention(s): Limited activity within patient's tolerance;Monitored during session;Ice applied    Home Living Family/patient expects to be discharged to:: Private residence Living Arrangements: Spouse/significant other Available Help at Discharge: Family Type of Home: House Home Access: Stairs to enter Entrance Stairs-Rails: Right Entrance Stairs-Number of Steps: 2 Home Layout: One level Home Equipment: Environmental consultant - 2 wheels      Prior Function Level of Independence: Independent               Hand Dominance        Extremity/Trunk Assessment   Upper Extremity Assessment Upper Extremity Assessment: Defer to OT evaluation    Lower Extremity Assessment Lower Extremity Assessment: RLE deficits/detail RLE Deficits / Details: R knee flexion 3/5 ROM 0-90degrees       Communication   Communication: No difficulties  Cognition Arousal/Alertness: Awake/alert Behavior During Therapy: WFL for tasks assessed/performed Overall Cognitive Status: Within Functional Limits for tasks assessed                                        General Comments      Exercises Total Joint Exercises Ankle Circles/Pumps: AROM;Strengthening;Both;15 reps;Supine Quad Sets: AROM;Strengthening;Both;Supine Hip ABduction/ADduction: AROM;Strengthening;10 reps;Right Straight Leg Raises:  AROM;Strengthening;Supine;Right Long Arc Quad: AROM;Strengthening;Seated;Right Knee Flexion: AROM;Strengthening;Right;15 reps;Seated Goniometric ROM: 0-90 degrees   Assessment/Plan    PT Assessment Patient needs continued PT services  PT Problem List Decreased strength;Decreased mobility;Decreased range of motion;Decreased activity tolerance;Decreased balance;Pain       PT Treatment Interventions DME instruction;Therapeutic  activities;Gait training;Therapeutic exercise;Patient/family education;Stair training;Balance training;Functional mobility training    PT Goals (Current goals can be found in the Care Plan section)  Acute Rehab PT Goals Patient Stated Goal: To walk PT Goal Formulation: With patient Time For Goal Achievement: 05/12/17 Potential to Achieve Goals: Good    Frequency 7X/week   Barriers to discharge        Co-evaluation               AM-PAC PT "6 Clicks" Daily Activity  Outcome Measure Difficulty turning over in bed (including adjusting bedclothes, sheets and blankets)?: None Difficulty moving from lying on back to sitting on the side of the bed? : None Difficulty sitting down on and standing up from a chair with arms (e.g., wheelchair, bedside commode, etc,.)?: A Little Help needed moving to and from a bed to chair (including a wheelchair)?: A Little Help needed walking in hospital room?: A Little Help needed climbing 3-5 steps with a railing? : A Little 6 Click Score: 20    End of Session   Activity Tolerance: Patient tolerated treatment well Patient left: in chair;with call bell/phone within reach Nurse Communication: Mobility status PT Visit Diagnosis: Muscle weakness (generalized) (M62.81);Difficulty in walking, not elsewhere classified (R26.2)    Time: 8115-7262 PT Time Calculation (min) (ACUTE ONLY): 30 min   Charges:   PT Evaluation $PT Eval Moderate Complexity: 1 Mod PT Treatments $Gait Training: 8-22 mins   PT G Codes:        Theodoro Grist, PT  Lelon Mast 05/05/2017, 9:27 AM

## 2017-05-05 NOTE — Progress Notes (Signed)
   Subjective: 1 Day Post-Op Procedure(s) (LRB): RIGHT TOTAL KNEE ARTHROPLASTY (Right)  Pt doing well this morning Pain under adequate control Denies any new symptoms or issues Patient reports pain as mild.  Objective:   VITALS:   Vitals:   05/05/17 0032 05/05/17 0530  BP: 109/63 (!) 115/97  Pulse: 77 68  Resp: 18 17  Temp: 98.7 F (37.1 C) 99 F (37.2 C)  SpO2: 92% 93%   Right knee dressing in place Drain removed nv intact distally No rashes or edema distally  LABS Recent Labs    05/05/17 0545  HGB 12.6*  HCT 37.4*  WBC 15.3*  PLT 317    Recent Labs    05/05/17 0545  NA 140  K 4.1  BUN 15  CREATININE 0.82  GLUCOSE 151*     Assessment/Plan: 1 Day Post-Op Procedure(s) (LRB): RIGHT TOTAL KNEE ARTHROPLASTY (Right) PT/OT Pain management as needed Pulmonary toilet Possible d/c tomorrow    Merla Riches PA-C, MPAS [facility] is now [facility]  [place] [address], [place], [place],  [address] Phone: [phone] [email or web address] Facebook  [employer]

## 2017-05-05 NOTE — Discharge Summary (Signed)
Physician Discharge Summary  Patient ID: Vincent Hernandez MRN: [id number] DOB/AGE: [birth year] 52 y.o.  Admit date: 05/04/2017 Discharge date:  05/05/17 Procedures:  Procedure(s) (LRB): RIGHT TOTAL KNEE ARTHROPLASTY (Right)  Attending Physician:  Dr. Theda Sers  Admission Diagnoses:   Right knee end stage osteoarthritis  Discharge Diagnoses:  Active Problems:   Primary osteoarthritis of right knee   S/P knee replacement  Past Medical History:  Diagnosis Date  . Arthritis   . GERD (gastroesophageal reflux disease)    pt. denies  . Headache(784.0)    Migraines  . Herniated disc, cervical    through T1      PCP: Harrison Mons, PA-C   Discharged Condition: good  Hospital Course:  Patient underwent the above stated procedure on 05/04/2017. Patient tolerated the procedure well and brought to the recovery room in good condition and subsequently to the floor.     Disposition: 01-Home or Self Care with follow up in 2 weeks     Discharge Instructions    Call MD / Call 911   Complete by:  As directed    If you experience chest pain or shortness of breath, CALL 911 and be transported to the hospital emergency room.  If you develope a fever above 101 F, pus (white drainage) or increased drainage or redness at the wound, or calf pain, call your surgeon's office.   Call MD / Call 911   Complete by:  As directed    If you experience chest pain or shortness of breath, CALL 911 and be transported to the hospital emergency room.  If you develope a fever above 101 F, pus (white drainage) or increased drainage or redness at the wound, or calf pain, call your surgeon's office.   Constipation Prevention   Complete by:  As directed    Drink plenty of fluids.  Prune juice may be helpful.  You may use a stool softener, such as Colace (over the counter) 100 mg twice a day.  Use MiraLax (over the counter) for constipation as needed.   Constipation Prevention   Complete by:  As directed    Drink  plenty of fluids.  Prune juice may be helpful.  You may use a stool softener, such as Colace (over the counter) 100 mg twice a day.  Use MiraLax (over the counter) for constipation as needed.   Diet - low sodium heart healthy   Complete by:  As directed    Diet - low sodium heart healthy   Complete by:  As directed    Discharge instructions   Complete by:  As directed    INSTRUCTIONS AFTER JOINT REPLACEMENT   Remove items at home which could result in a fall. This includes throw rugs or furniture in walking pathways ICE to the affected joint every three hours while awake for 30 minutes at a time, for at least the first 3-5 days, and then as needed for pain and swelling.  Continue to use ice for pain and swelling. You may notice swelling that will progress down to the foot and ankle.  This is normal after surgery.  Elevate your leg when you are not up walking on it.   Continue to use the breathing machine you got in the hospital (incentive spirometer) which will help keep your temperature down.  It is common for your temperature to cycle up and down following surgery, especially at night when you are not up moving around and exerting yourself.  The breathing machine keeps your  lungs expanded and your temperature down.   DIET:  As you were doing prior to hospitalization, we recommend a well-balanced diet.  DRESSING / WOUND CARE / SHOWERING  Keep the surgical dressing until follow up.  The dressing is water proof, so you can shower without any extra covering.  IF THE DRESSING FALLS OFF or the wound gets wet inside, change the dressing with sterile gauze.  Please use good hand washing techniques before changing the dressing.  Do not use any lotions or creams on the incision until instructed by your surgeon.    ACTIVITY  Increase activity slowly as tolerated, but follow the weight bearing instructions below.   No driving for 6 weeks or until further direction given by your physician.  You cannot  drive while taking narcotics.  No lifting or carrying greater than 10 lbs. until further directed by your surgeon. Avoid periods of inactivity such as sitting longer than an hour when not asleep. This helps prevent blood clots.  You may return to work once you are authorized by your doctor.     WEIGHT BEARING   Weight bearing as tolerated with assist device (walker, cane, etc) as directed, use it as long as suggested by your surgeon or therapist, typically at least 4-6 weeks.   EXERCISES  Results after joint replacement surgery are often greatly improved when you follow the exercise, range of motion and muscle strengthening exercises prescribed by your doctor. Safety measures are also important to protect the joint from further injury. Any time any of these exercises cause you to have increased pain or swelling, decrease what you are doing until you are comfortable again and then slowly increase them. If you have problems or questions, call your caregiver or physical therapist for advice.   Rehabilitation is important following a joint replacement. After just a few days of immobilization, the muscles of the leg can become weakened and shrink (atrophy).  These exercises are designed to build up the tone and strength of the thigh and leg muscles and to improve motion. Often times heat used for twenty to thirty minutes before working out will loosen up your tissues and help with improving the range of motion but do not use heat for the first two weeks following surgery (sometimes heat can increase post-operative swelling).   These exercises can be done on a training (exercise) mat, on the floor, on a table or on a bed. Use whatever works the best and is most comfortable for you.    Use music or television while you are exercising so that the exercises are a pleasant break in your day. This will make your life better with the exercises acting as a break in your routine that you can look forward to.    Perform all exercises about fifteen times, three times per day or as directed.  You should exercise both the operative leg and the other leg as well.   Exercises include:   Quad Sets - Tighten up the muscle on the front of the thigh (Quad) and hold for 5-10 seconds.   Straight Leg Raises - With your knee straight (if you were given a brace, keep it on), lift the leg to 60 degrees, hold for 3 seconds, and slowly lower the leg.  Perform this exercise against resistance later as your leg gets stronger.  Leg Slides: Lying on your back, slowly slide your foot toward your buttocks, bending your knee up off the floor (only go as far as is  comfortable). Then slowly slide your foot back down until your leg is flat on the floor again.  Angel Wings: Lying on your back spread your legs to the side as far apart as you can without causing discomfort.  Hamstring Strength:  Lying on your back, push your heel against the floor with your leg straight by tightening up the muscles of your buttocks.  Repeat, but this time bend your knee to a comfortable angle, and push your heel against the floor.  You may put a pillow under the heel to make it more comfortable if necessary.   A rehabilitation program following joint replacement surgery can speed recovery and prevent re-injury in the future due to weakened muscles. Contact your doctor or a physical therapist for more information on knee rehabilitation.    CONSTIPATION  Constipation is defined medically as fewer than three stools per week and severe constipation as less than one stool per week.  Even if you have a regular bowel pattern at home, your normal regimen is likely to be disrupted due to multiple reasons following surgery.  Combination of anesthesia, postoperative narcotics, change in appetite and fluid intake all can affect your bowels.   YOU MUST use at least one of the following options; they are listed in order of increasing strength to get the job done.   They are all available over the counter, and you may need to use some, POSSIBLY even all of these options:    Drink plenty of fluids (prune juice may be helpful) and high fiber foods Colace 100 mg by mouth twice a day  Senokot for constipation as directed and as needed Dulcolax (bisacodyl), take with full glass of water  Miralax (polyethylene glycol) once or twice a day as needed.  If you have tried all these things and are unable to have a bowel movement in the first 3-4 days after surgery call either your surgeon or your primary doctor.    If you experience loose stools or diarrhea, hold the medications until you stool forms back up.  If your symptoms do not get better within 1 week or if they get worse, check with your doctor.  If you experience "the worst abdominal pain ever" or develop nausea or vomiting, please contact the office immediately for further recommendations for treatment.   ITCHING:  If you experience itching with your medications, try taking only a single pain pill, or even half a pain pill at a time.  You can also use Benadryl over the counter for itching or also to help with sleep.   TED HOSE STOCKINGS:  Use stockings on both legs until for at least 2 weeks or as directed by physician office. They may be removed at night for sleeping.  MEDICATIONS:  See your medication summary on the "After Visit Summary" that nursing will review with you.  You may have some home medications which will be placed on hold until you complete the course of blood thinner medication.  It is important for you to complete the blood thinner medication as prescribed.  PRECAUTIONS:  If you experience chest pain or shortness of breath - call 911 immediately for transfer to the hospital emergency department.   If you develop a fever greater that 101 F, purulent drainage from wound, increased redness or drainage from wound, foul odor from the wound/dressing, or calf pain - CONTACT YOUR SURGEON.  FOLLOW-UP APPOINTMENTS:  If you do not already have a post-op appointment, please call the office for an appointment to be seen by your surgeon.  Guidelines for how soon to be seen are listed in your "After Visit Summary", but are typically between 1-4 weeks after surgery.  OTHER INSTRUCTIONS:   Knee Replacement:  Do not place pillow under knee, focus on keeping the knee straight while resting. CPM instructions: 0-90 degrees, 2 hours in the morning, 2 hours in the afternoon, and 2 hours in the evening. Place foam block, curve side up under heel at all times except when in CPM or when walking.  DO NOT modify, tear, cut, or change the foam block in any way.  MAKE SURE YOU:  Understand these instructions.  Get help right away if you are not doing well or get worse.    Thank you for letting us be a part of your medical care team.  It is a privilege we respect greatly.  We hope these instructions will help you stay on track for a fast and full recovery!   Increase activity slowly as tolerated   Complete by:  As directed    Increase activity slowly as tolerated   Complete by:  As directed       Allergies as of 05/05/2017      Reactions   Robaxin [methocarbamol] Itching   Tdap [diphth-acell Pertussis-tetanus] Other (See Comments)   "Locked up" aches and pains      Medication List    TAKE these medications   aspirin EC 325 MG tablet Take 1 tablet (325 mg total) by mouth 2 (two) times daily. What changed:    medication strength  how much to take  when to take this   aspirin-acetaminophen-caffeine 250-250-65 MG tablet Commonly known as:  EXCEDRIN MIGRAINE Take 2 tablets by mouth daily as needed for headache.   baclofen 10 MG tablet Commonly known as:  LIORESAL Take 1 tablet (10 mg total) by mouth every 6 (six) hours as needed for muscle spasms.   Cranberry 500 MG Tabs Take 1,000 mg by mouth daily.   DUEXIS 800-26.6 MG Tabs Generic drug:   Ibuprofen-Famotidine Take 1 tablet by mouth 3 (three) times daily as needed (pain).   Echinacea 400 MG Caps Take 800 mg by mouth daily.   Fish Oil 1000 MG Caps Take 4,000 mg by mouth daily.   JOINT HEALTH PO Take 2 tablets by mouth daily.   multivitamin with minerals Tabs tablet Take 1 tablet by mouth daily.   oxyCODONE 5 MG immediate release tablet Commonly known as:  ROXICODONE Take 1 tablet (5 mg total) by mouth every 4 (four) hours as needed.   pyridOXINE 100 MG tablet Commonly known as:  VITAMIN B-6 Take 100 mg by mouth daily.   SUPER B COMPLEX/C Caps Take 1 capsule by mouth daily.   vitamin C 500 MG tablet Commonly known as:  ASCORBIC ACID Take 500 mg by mouth daily.        Kathrynn Speed, MPAS Physician Assistant [place] is now [facility] [address], [address], [place], [place] [address] Phone: [phone] [email or web address] Facebook  [employer]

## 2017-05-05 NOTE — Progress Notes (Signed)
Physical Therapy Treatment Patient Details Name: Vincent Hernandez MRN: [id number] DOB: 01-06-1966 Today's Date: 05/05/2017    History of Present Illness Vincent Hernandez is a 52 y.o. year old male with end stage bone on bone arthritis of the knee who failed conservative treatment and elected for Total Knee Arthroplasty.     PT Comments    Pt is making excellent progress with mobility. Pt has completed step training and reinforced R LE exercises with a handout provided. Pt is currently mod Indep. with RW on the unit with assistance to push IV pole. Pt has met most mobility goals and is ready for d/c home when medically cleared. Recommended ambulation in the hall with his spouse assisting with the IV pole. PT to follow-up for R LE therex and knee ROM until d/c home.  Follow Up Recommendations  Follow surgeon's recommendation for DC plan and follow-up therapies     Equipment Recommendations  None recommended by PT    Recommendations for Other Services       Precautions / Restrictions Precautions Precautions: Knee Restrictions Other Position/Activity Restrictions: WBAT    Mobility  Bed Mobility Overal bed mobility: Modified Independent                Transfers Overall transfer level: Modified independent Equipment used: Rolling walker (2 wheeled)                Ambulation/Gait Ambulation/Gait assistance: Modified independent (Device/Increase time) Ambulation Distance (Feet): 200 Feet Assistive device: Rolling walker (2 wheeled) Gait Pattern/deviations: Step-through pattern;Decreased weight shift to right Gait velocity: good       Stairs Stairs: Yes   Stair Management: One rail Right;Sideways Number of Stairs: 3    Wheelchair Mobility    Modified Rankin (Stroke Patients Only)       Balance Overall balance assessment: Needs assistance Sitting-balance support: No upper extremity supported;Feet supported Sitting balance-Leahy Scale: Normal      Standing balance support: Bilateral upper extremity supported Standing balance-Leahy Scale: Good                              Cognition Arousal/Alertness: Awake/alert Behavior During Therapy: WFL for tasks assessed/performed Overall Cognitive Status: Within Functional Limits for tasks assessed                                        Exercises Total Joint Exercises Ankle Circles/Pumps: AROM;Strengthening;Both;10 reps;Seated Quad Sets: AROM;Strengthening;Both;Supine Heel Slides: AROM;Strengthening;Right;10 reps;Supine Hip ABduction/ADduction: AROM;Strengthening;Right;10 reps;Supine Straight Leg Raises: AROM;Strengthening;Right;Supine Long Arc Quad: AROM;Strengthening;Right;10 reps;Seated Knee Flexion: AROM;Strengthening;Right;Seated;10 reps Goniometric ROM: 0-90*    General Comments        Pertinent Vitals/Pain Pain Assessment: 0-10 Pain Score: 2  Pain Location: R Knee Pain Descriptors / Indicators: Discomfort Pain Intervention(s): Limited activity within patient's tolerance;Monitored during session;Repositioned    Home Living                      Prior Function            PT Goals (current goals can now be found in the care plan section) Progress towards PT goals: Progressing toward goals    Frequency    7X/week      PT Plan Current plan remains appropriate    Co-evaluation  AM-PAC PT "6 Clicks" Daily Activity  Outcome Measure  Difficulty turning over in bed (including adjusting bedclothes, sheets and blankets)?: None Difficulty moving from lying on back to sitting on the side of the bed? : None Difficulty sitting down on and standing up from a chair with arms (e.g., wheelchair, bedside commode, etc,.)?: A Little Help needed moving to and from a bed to chair (including a wheelchair)?: A Little Help needed walking in hospital room?: A Little Help needed climbing 3-5 steps with a railing? : A Little 6  Click Score: 20    End of Session Equipment Utilized During Treatment: Gait belt Activity Tolerance: Patient tolerated treatment well Patient left: in bed;with call bell/phone within reach;with family/visitor present Nurse Communication: Mobility status(Pt mod indpeendent with RW assistance with iv pole) PT Visit Diagnosis: Muscle weakness (generalized) (M62.81);Difficulty in walking, not elsewhere classified (R26.2)     Time: 6016-5800 PT Time Calculation (min) (ACUTE ONLY): 29 min  Charges:  $Gait Training: 8-22 mins $Therapeutic Exercise: 8-22 mins                    G Codes:      Theodoro Grist, PT   Lelon Mast 05/05/2017, 1:36 PM

## 2017-05-06 NOTE — Care Management Note (Signed)
Case Management Note  Patient Details  Name: Vincent Hernandez MRN: [id number] Date of Birth: 1965-08-17  Subjective/Objective:    Right TKA                Action/Plan: 05/05/2017 spoke to pt and scheduled for OP PT, he has RW and 3n1 bedside commode at home.    Expected Discharge Date:  05/05/17               Expected Discharge Plan:  OP Rehab  In-House Referral:  NA  Discharge planning Services  CM Consult  Post Acute Care Choice:  NA Choice offered to:  NA  DME Arranged:  N/A DME Agency:  NA  HH Arranged:  NA HH Agency:  NA  Status of Service:  Completed, signed off  If discussed at [place] of Stay Meetings, dates discussed:    Additional Comments:  Erenest Rasher, RN 05/06/2017, 9:33 AM

## 2017-05-07 ENCOUNTER — Encounter (HOSPITAL_COMMUNITY): Payer: Self-pay | Admitting: Specialist

## 2017-05-08 ENCOUNTER — Encounter (HOSPITAL_COMMUNITY): Payer: Self-pay | Admitting: Specialist

## 2017-05-18 DIAGNOSIS — Z96651 Presence of right artificial knee joint: Secondary | ICD-10-CM | POA: Diagnosis not present

## 2017-05-18 DIAGNOSIS — Z471 Aftercare following joint replacement surgery: Secondary | ICD-10-CM | POA: Diagnosis not present

## 2017-05-23 DIAGNOSIS — M25661 Stiffness of right knee, not elsewhere classified: Secondary | ICD-10-CM | POA: Diagnosis not present

## 2017-05-25 DIAGNOSIS — M25511 Pain in right shoulder: Secondary | ICD-10-CM | POA: Diagnosis not present

## 2017-05-30 DIAGNOSIS — M25511 Pain in right shoulder: Secondary | ICD-10-CM | POA: Diagnosis not present

## 2017-05-30 DIAGNOSIS — M75121 Complete rotator cuff tear or rupture of right shoulder, not specified as traumatic: Secondary | ICD-10-CM | POA: Diagnosis not present

## 2017-05-31 DIAGNOSIS — M25661 Stiffness of right knee, not elsewhere classified: Secondary | ICD-10-CM | POA: Diagnosis not present

## 2017-06-04 ENCOUNTER — Encounter: Payer: Self-pay | Admitting: Physician Assistant

## 2017-06-07 DIAGNOSIS — M25661 Stiffness of right knee, not elsewhere classified: Secondary | ICD-10-CM | POA: Diagnosis not present

## 2017-06-12 DIAGNOSIS — M25661 Stiffness of right knee, not elsewhere classified: Secondary | ICD-10-CM | POA: Diagnosis not present

## 2017-06-19 DIAGNOSIS — M19011 Primary osteoarthritis, right shoulder: Secondary | ICD-10-CM | POA: Diagnosis not present

## 2017-06-19 DIAGNOSIS — M7541 Impingement syndrome of right shoulder: Secondary | ICD-10-CM | POA: Diagnosis not present

## 2017-06-19 DIAGNOSIS — G8918 Other acute postprocedural pain: Secondary | ICD-10-CM | POA: Diagnosis not present

## 2017-06-19 DIAGNOSIS — M75121 Complete rotator cuff tear or rupture of right shoulder, not specified as traumatic: Secondary | ICD-10-CM | POA: Diagnosis not present

## 2017-06-19 DIAGNOSIS — S46191A Other injury of muscle, fascia and tendon of long head of biceps, right arm, initial encounter: Secondary | ICD-10-CM | POA: Diagnosis not present

## 2017-06-19 DIAGNOSIS — M659 Synovitis and tenosynovitis, unspecified: Secondary | ICD-10-CM | POA: Diagnosis not present

## 2017-06-19 DIAGNOSIS — S46011A Strain of muscle(s) and tendon(s) of the rotator cuff of right shoulder, initial encounter: Secondary | ICD-10-CM | POA: Diagnosis not present

## 2017-06-19 DIAGNOSIS — X58XXXA Exposure to other specified factors, initial encounter: Secondary | ICD-10-CM | POA: Diagnosis not present

## 2017-06-19 DIAGNOSIS — Y999 Unspecified external cause status: Secondary | ICD-10-CM | POA: Diagnosis not present

## 2017-06-19 HISTORY — PX: BICEPS TENODESIS: SHX895

## 2017-06-19 HISTORY — PX: ROTATOR CUFF REPAIR: SHX139

## 2017-06-20 ENCOUNTER — Telehealth: Payer: Self-pay | Admitting: Physician Assistant

## 2017-06-20 NOTE — Telephone Encounter (Signed)
Pre-operative clearance request for planned RIGHT shoulder EUA, scope, deb, synovectomy with possible synovial biopsy, SAD, DCR, RCR, SA-SAD-SCOPE with partial acromioplasty w/wo coracoacromial release, SA-DCR-SCOPE, RIGHT: SA-synovectomy complete, SA-debridement limited under general anesthesia with Interscalene block.  Pre-operative examination, including EKG and labs performed 02/07/2017. Recommendations regarding aspirin per Up-to-Date (stop 7 days prior, resume with PO intake).  Clearance signed.

## 2017-06-20 NOTE — Telephone Encounter (Signed)
Surgical Clearance form sign and faxed back to [facility] at [phone]

## 2017-06-20 NOTE — Telephone Encounter (Signed)
Faxed to [facility] at [phone]

## 2017-06-27 DIAGNOSIS — Z4789 Encounter for other orthopedic aftercare: Secondary | ICD-10-CM | POA: Diagnosis not present

## 2017-07-04 DIAGNOSIS — M25611 Stiffness of right shoulder, not elsewhere classified: Secondary | ICD-10-CM | POA: Diagnosis not present

## 2017-07-06 DIAGNOSIS — M1712 Unilateral primary osteoarthritis, left knee: Secondary | ICD-10-CM | POA: Diagnosis not present

## 2017-07-10 DIAGNOSIS — M25611 Stiffness of right shoulder, not elsewhere classified: Secondary | ICD-10-CM | POA: Diagnosis not present

## 2017-07-17 DIAGNOSIS — M25661 Stiffness of right knee, not elsewhere classified: Secondary | ICD-10-CM | POA: Diagnosis not present

## 2017-07-17 DIAGNOSIS — M25611 Stiffness of right shoulder, not elsewhere classified: Secondary | ICD-10-CM | POA: Diagnosis not present

## 2017-07-24 DIAGNOSIS — M25661 Stiffness of right knee, not elsewhere classified: Secondary | ICD-10-CM | POA: Diagnosis not present

## 2017-07-24 DIAGNOSIS — M25611 Stiffness of right shoulder, not elsewhere classified: Secondary | ICD-10-CM | POA: Diagnosis not present

## 2017-07-31 DIAGNOSIS — M25611 Stiffness of right shoulder, not elsewhere classified: Secondary | ICD-10-CM | POA: Diagnosis not present

## 2017-08-02 DIAGNOSIS — M25661 Stiffness of right knee, not elsewhere classified: Secondary | ICD-10-CM | POA: Diagnosis not present

## 2017-08-02 DIAGNOSIS — M25611 Stiffness of right shoulder, not elsewhere classified: Secondary | ICD-10-CM | POA: Diagnosis not present

## 2017-08-07 DIAGNOSIS — M25611 Stiffness of right shoulder, not elsewhere classified: Secondary | ICD-10-CM | POA: Diagnosis not present

## 2017-08-07 DIAGNOSIS — M25562 Pain in left knee: Secondary | ICD-10-CM | POA: Diagnosis not present

## 2017-08-07 DIAGNOSIS — M25561 Pain in right knee: Secondary | ICD-10-CM | POA: Diagnosis not present

## 2017-08-09 DIAGNOSIS — M25561 Pain in right knee: Secondary | ICD-10-CM | POA: Diagnosis not present

## 2017-08-09 DIAGNOSIS — M25562 Pain in left knee: Secondary | ICD-10-CM | POA: Diagnosis not present

## 2017-08-09 DIAGNOSIS — M25611 Stiffness of right shoulder, not elsewhere classified: Secondary | ICD-10-CM | POA: Diagnosis not present

## 2017-08-10 ENCOUNTER — Encounter: Payer: Self-pay | Admitting: Physician Assistant

## 2017-08-14 DIAGNOSIS — M25611 Stiffness of right shoulder, not elsewhere classified: Secondary | ICD-10-CM | POA: Diagnosis not present

## 2017-08-14 DIAGNOSIS — M25561 Pain in right knee: Secondary | ICD-10-CM | POA: Diagnosis not present

## 2017-08-14 DIAGNOSIS — M25562 Pain in left knee: Secondary | ICD-10-CM | POA: Diagnosis not present

## 2017-08-16 DIAGNOSIS — M25562 Pain in left knee: Secondary | ICD-10-CM | POA: Diagnosis not present

## 2017-08-16 DIAGNOSIS — M25611 Stiffness of right shoulder, not elsewhere classified: Secondary | ICD-10-CM | POA: Diagnosis not present

## 2017-08-16 DIAGNOSIS — M25561 Pain in right knee: Secondary | ICD-10-CM | POA: Diagnosis not present

## 2017-08-21 DIAGNOSIS — M25562 Pain in left knee: Secondary | ICD-10-CM | POA: Diagnosis not present

## 2017-08-21 DIAGNOSIS — M25611 Stiffness of right shoulder, not elsewhere classified: Secondary | ICD-10-CM | POA: Diagnosis not present

## 2017-08-21 DIAGNOSIS — M25561 Pain in right knee: Secondary | ICD-10-CM | POA: Diagnosis not present

## 2017-08-28 DIAGNOSIS — M25562 Pain in left knee: Secondary | ICD-10-CM | POA: Diagnosis not present

## 2017-08-28 DIAGNOSIS — M25611 Stiffness of right shoulder, not elsewhere classified: Secondary | ICD-10-CM | POA: Diagnosis not present

## 2017-08-28 DIAGNOSIS — M25561 Pain in right knee: Secondary | ICD-10-CM | POA: Diagnosis not present

## 2017-09-10 ENCOUNTER — Encounter: Payer: Self-pay | Admitting: Family Medicine

## 2017-09-10 ENCOUNTER — Ambulatory Visit (INDEPENDENT_AMBULATORY_CARE_PROVIDER_SITE_OTHER): Payer: BLUE CROSS/BLUE SHIELD | Admitting: Family Medicine

## 2017-09-10 VITALS — BP 136/86 | HR 82 | Temp 98.5°F | Ht 69.0 in | Wt 240.0 lb

## 2017-09-10 DIAGNOSIS — Z0001 Encounter for general adult medical examination with abnormal findings: Secondary | ICD-10-CM | POA: Diagnosis not present

## 2017-09-10 DIAGNOSIS — E785 Hyperlipidemia, unspecified: Secondary | ICD-10-CM | POA: Insufficient documentation

## 2017-09-10 DIAGNOSIS — Z01818 Encounter for other preprocedural examination: Secondary | ICD-10-CM | POA: Diagnosis not present

## 2017-09-10 DIAGNOSIS — R739 Hyperglycemia, unspecified: Secondary | ICD-10-CM | POA: Diagnosis not present

## 2017-09-10 DIAGNOSIS — Z13220 Encounter for screening for lipoid disorders: Secondary | ICD-10-CM | POA: Diagnosis not present

## 2017-09-10 DIAGNOSIS — G43909 Migraine, unspecified, not intractable, without status migrainosus: Secondary | ICD-10-CM | POA: Diagnosis not present

## 2017-09-10 DIAGNOSIS — H9319 Tinnitus, unspecified ear: Secondary | ICD-10-CM | POA: Insufficient documentation

## 2017-09-10 DIAGNOSIS — M1990 Unspecified osteoarthritis, unspecified site: Secondary | ICD-10-CM | POA: Insufficient documentation

## 2017-09-10 DIAGNOSIS — M170 Bilateral primary osteoarthritis of knee: Secondary | ICD-10-CM | POA: Diagnosis not present

## 2017-09-10 LAB — COMPREHENSIVE METABOLIC PANEL
ALBUMIN: 4.2 g/dL (ref 3.5–5.2)
ALK PHOS: 74 U/L (ref 39–117)
ALT: 23 U/L (ref 0–53)
AST: 22 U/L (ref 0–37)
BUN: 19 mg/dL (ref 6–23)
CHLORIDE: 104 meq/L (ref 96–112)
CO2: 28 mEq/L (ref 19–32)
Calcium: 9.3 mg/dL (ref 8.4–10.5)
Creatinine, Ser: 0.77 mg/dL (ref 0.40–1.50)
GFR: 112.18 mL/min (ref >60.00)
Glucose, Bld: 98 mg/dL (ref 70–99)
POTASSIUM: 4.1 meq/L (ref 3.5–5.1)
Sodium: 140 mEq/L (ref 135–145)
TOTAL PROTEIN: 6.7 g/dL (ref 6.0–8.3)
Total Bilirubin: 0.6 mg/dL (ref 0.2–1.2)

## 2017-09-10 LAB — LIPID PANEL
CHOLESTEROL: 166 mg/dL (ref 0–200)
HDL: 45.1 mg/dL (ref >39.00)
LDL Cholesterol: 109 mg/dL — ABNORMAL HIGH (ref 0–99)
NonHDL: 120.62
TRIGLYCERIDES: 59 mg/dL (ref 0.0–149.0)
Total CHOL/HDL Ratio: 4
VLDL: 11.8 mg/dL (ref 0.0–40.0)

## 2017-09-10 LAB — CBC
HEMATOCRIT: 42.8 % (ref 39.0–52.0)
HEMOGLOBIN: 14.7 g/dL (ref 13.0–17.0)
MCHC: 34.5 g/dL (ref 30.0–36.0)
MCV: 92 fl (ref 78.0–100.0)
Platelets: 289 10*3/uL (ref 150.0–400.0)
RBC: 4.65 Mil/uL (ref 4.22–5.81)
RDW: 14.3 % (ref 11.5–15.5)
WBC: 6.4 10*3/uL (ref 4.0–10.5)

## 2017-09-10 LAB — HEMOGLOBIN A1C: HEMOGLOBIN A1C: 5.1 % (ref 4.6–6.5)

## 2017-09-10 NOTE — Progress Notes (Signed)
Please inform patient of the following:  Blood counts are normal.  Electrolytes, kidney function, liver function, and blood sugar levels are normal. His "bad" cholesterol is a bit high but the rest of his cholesterol levels are normal. No need to start meds at this point, but he should work on healthy diet and exercise and we can recheck in 1 year.  Vincent Hernandez. Jerline Pain, MD 09/10/2017 4:27 PM

## 2017-09-10 NOTE — Assessment & Plan Note (Signed)
Follows with orthopedics.  Request surgical clearance today. Patient has no risk factors and will be undergoing left knee replacement.  EKG today was normal.  His overall risk is low and he has no contraindications for proceeding with surgery. Clearance form was completed.

## 2017-09-10 NOTE — Patient Instructions (Addendum)
It was very nice to see you today!  Please keep up the good work! We will complete your surgical clearance form today.  Your EKG is normal.  We will check blood work.  Come back to see me in 1 year for your next annual physical, or sooner as needed.  Take care, Dr Jerline Pain   Preventive Care 40-64 Years, Male Preventive care refers to lifestyle choices and visits with your health care provider that can promote health and wellness. What does preventive care include?  A yearly physical exam. This is also called an annual well check.  Dental exams once or twice a year.  Routine eye exams. Ask your health care provider how often you should have your eyes checked.  Personal lifestyle choices, including: ? Daily care of your teeth and gums. ? Regular physical activity. ? Eating a healthy diet. ? Avoiding tobacco and drug use. ? Limiting alcohol use. ? Practicing safe sex. ? Taking low-dose aspirin every day starting at age 69. What happens during an annual well check? The services and screenings done by your health care provider during your annual well check will depend on your age, overall health, lifestyle risk factors, and family history of disease. Counseling Your health care provider may ask you questions about your:  Alcohol use.  Tobacco use.  Drug use.  Emotional well-being.  Home and relationship well-being.  Sexual activity.  Eating habits.  Work and work Statistician.  Screening You may have the following tests or measurements:  Height, weight, and BMI.  Blood pressure.  Lipid and cholesterol levels. These may be checked every 5 years, or more frequently if you are over 75 years old.  Skin check.  Lung cancer screening. You may have this screening every year starting at age 32 if you have a 30-pack-year history of smoking and currently smoke or have quit within the past 15 years.  Fecal occult blood test (FOBT) of the stool. You may have this test  every year starting at age 81.  Flexible sigmoidoscopy or colonoscopy. You may have a sigmoidoscopy every 5 years or a colonoscopy every 10 years starting at age 40.  Prostate cancer screening. Recommendations will vary depending on your family history and other risks.  Hepatitis C blood test.  Hepatitis B blood test.  Sexually transmitted disease (STD) testing.  Diabetes screening. This is done by checking your blood sugar (glucose) after you have not eaten for a while (fasting). You may have this done every 1-3 years.  Discuss your test results, treatment options, and if necessary, the need for more tests with your health care provider. Vaccines Your health care provider may recommend certain vaccines, such as:  Influenza vaccine. This is recommended every year.  Tetanus, diphtheria, and acellular pertussis (Tdap, Td) vaccine. You may need a Td booster every 10 years.  Varicella vaccine. You may need this if you have not been vaccinated.  Zoster vaccine. You may need this after age 68.  Measles, mumps, and rubella (MMR) vaccine. You may need at least one dose of MMR if you were born in 1957 or later. You may also need a second dose.  Pneumococcal 13-valent conjugate (PCV13) vaccine. You may need this if you have certain conditions and have not been vaccinated.  Pneumococcal polysaccharide (PPSV23) vaccine. You may need one or two doses if you smoke cigarettes or if you have certain conditions.  Meningococcal vaccine. You may need this if you have certain conditions.  Hepatitis A vaccine. You may  need this if you have certain conditions or if you travel or work in places where you may be exposed to hepatitis A.  Hepatitis B vaccine. You may need this if you have certain conditions or if you travel or work in places where you may be exposed to hepatitis B.  Haemophilus influenzae type b (Hib) vaccine. You may need this if you have certain risk factors.  Talk to your health  care provider about which screenings and vaccines you need and how often you need them. This information is not intended to replace advice given to you by your health care provider. Make sure you discuss any questions you have with your health care provider. Document Released: 03/12/2015 Document Revised: 11/03/2015 Document Reviewed: 12/15/2014 Elsevier Interactive Patient Education  [employer].

## 2017-09-10 NOTE — Progress Notes (Signed)
Subjective:  Vincent Hernandez is a 52 y.o. male who presents today for his annual comprehensive physical exam and to establish care.  HPI:  He has no acute complaints today.   He has several stable, chronic conditions outlined below:  1. Knee Osteoarthritis.  Several year history.  Currently follows with orthopedics for this.  Has had left total knee replacement and will be getting right total knee replacement soon. 2.  Migraines.  Several year history.  Symptoms are stable.  Takes over-the-counter analgesics as needed.  Has been on Imitrex in the past.  Lifestyle Diet: Cut out all sweetened beverages. Trying to eat healthy diet.  Exercise: Working with therapist. No other specific exercises.  Depression screen PHQ 2/9 09/10/2017  Decreased Interest 0  Down, Depressed, Hopeless 0  PHQ - 2 Score 0   Health Maintenance Due  Topic Date Due  . COLONOSCOPY  05/07/2014    ROS: Per HPI, otherwise a complete review of systems was negative.   PMH:  The following were reviewed and entered/updated in epic: Past Medical History:  Diagnosis Date  . Arthritis   . GERD (gastroesophageal reflux disease)    pt. denies  . Headache(784.0)    Migraines  . Herniated disc, cervical    through T1   Patient Active Problem List   Diagnosis Date Noted  . Tinnitus 09/10/2017  . Primary osteoarthritis of both knees 09/10/2017  . S/P knee replacement 05/04/2017  . Migraine headache 08/14/2015  . Herniated disc, cervical 08/14/2015   Past Surgical History:  Procedure Laterality Date  . ANTERIOR CERVICAL DECOMP/DISCECTOMY FUSION N/A 09/01/2015   Procedure: ACDF C7-T1;  Surgeon: Melina Schools, MD;  Location: [facility];  Service: Orthopedics;  Laterality: N/A;  . BICEPS TENODESIS Right 06/19/2017   Dr. Theda Sers  . CARPAL TUNNEL RELEASE Bilateral   . FOOT ARTHROPLASTY Right 2011   screws  . GANGLION CYST EXCISION    . KNEE ARTHROSCOPY Left 1/14  . ORIF TOE FRACTURE Left 11/21/2012   Procedure:  OPEN REDUCTION INTERNAL FIXATION (ORIF) LEFT FOURTH METATARSAL NONUNION FRACTURE;  Surgeon: Wylene Simmer, MD;  Location: [facility];  Service: Orthopedics;  Laterality: Left;  . REPAIR QUADRICEPS / HAMSTRING MUSCLE Right   . ROTATOR CUFF REPAIR Right 06/19/2017   Dr. Theda Sers  . SEPTOPLASTY  1995  . SHOULDER ARTHROSCOPY W/ ROTATOR CUFF REPAIR Left 2013  . TOTAL KNEE ARTHROPLASTY Right 05/04/2017   Procedure: RIGHT TOTAL KNEE ARTHROPLASTY;  Surgeon: Sydnee Cabal, MD;  Location: [facility];  Service: Orthopedics;  Laterality: Right;  Adductor Block    Family History  Problem Relation Age of Onset  . Cancer Father        lung  . Stroke Father 22       1973    Medications- reviewed and updated Current Outpatient Medications  Medication Sig Dispense Refill  . Echinacea 400 MG CAPS Take 800 mg by mouth daily.    . Glucosamine-MSM-Hyaluronic Acd (JOINT HEALTH PO) Take 2 tablets by mouth daily.    . Ibuprofen-Famotidine (DUEXIS) 800-26.6 MG TABS Take 1 tablet by mouth 3 (three) times daily as needed (pain).    . Multiple Vitamin (MULTIVITAMIN WITH MINERALS) TABS tablet Take 1 tablet by mouth daily.    . Omega-3 Fatty Acids (FISH OIL) 1000 MG CAPS Take 4,000 mg by mouth daily.    Marland Kitchen pyridOXINE (VITAMIN B-6) 100 MG tablet Take 100 mg by mouth daily.    Beryle Quant B COMPLEX/C CAPS Take 1  capsule by mouth daily.    . vitamin C (ASCORBIC ACID) 500 MG tablet Take 500 mg by mouth daily.     No current facility-administered medications for this visit.     Allergies-reviewed and updated Allergies  Allergen Reactions  . Robaxin [Methocarbamol] Itching  . Tdap [Tetanus-Diphth-Acell Pertussis] Other (See Comments)    "Locked up" aches and pains    Social History   Socioeconomic History  . Marital status: Married    Spouse name: Rodena Piety  . Number of children: 3  . Years of education: 19  . Highest education level: Not on file  Occupational History  . Occupation: Physiological scientist      Comment: [employer]  Social Needs  . Financial resource strain: Not on file  . Food insecurity:    Worry: Not on file    Inability: Not on file  . Transportation needs:    Medical: Not on file    Non-medical: Not on file  Tobacco Use  . Smoking status: Former Research scientist (life sciences)  . Smokeless tobacco: Never Used  . 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 Alcohol use: No    Alcohol/week: 0.0 oz    Frequency: Never  . Drug use: No  . Sexual activity: Yes    Partners: Female  Lifestyle  . Physical activity:    Days per week: Not on file    Minutes per session: Not on file  . Stress: Not on file  Relationships  . Social connections:    Talks on phone: Not on file    Gets together: Not on file    Attends religious service: Not on file    Active member of club or organization: Not on file    Attends meetings of clubs or organizations: Not on file    Relationship status: Not on file  Other Topics Concern  . Not on file  Social History Narrative   Lives with his wife and 4 dogs (3 Benny Lennert and a [place]).   Adult children live in [place] and [place].    Objective:  Physical Exam: BP 136/86 (BP Location: Left Arm, Patient Position: Sitting, Cuff Size: Normal)   Pulse 82   Temp 98.5 F (36.9 C) (Oral)   Ht 5\' 9"  (1.753 m)   Wt 240 lb (108.9 kg)   SpO2 97%   BMI 35.44 kg/m   Body mass index is 35.44 kg/m. Wt Readings from Last 3 Encounters:  09/10/17 240 lb (108.9 kg)  05/04/17 250 lb (113.4 kg)  04/27/17 250 lb (113.4 kg)   Gen: NAD, resting comfortably HEENT: TMs normal bilaterally. OP clear. No thyromegaly noted.  CV: RRR with no murmurs appreciated Pulm: NWOB, CTAB with no crackles, wheezes, or rhonchi GI: Normal bowel sounds present. Soft, Nontender, Nondistended. MSK: no edema, cyanosis, or clubbing noted Skin: warm, dry Neuro: CN2-12 grossly intact. 4+/5 strength in left shoulder, otherwise strength 5/5  in upper and lower extremities. Reflexes symmetric and intact bilaterally.  Psych: Normal affect and thought content  EKG: NSR. No ischemic changes.   Assessment/Plan:  Primary osteoarthritis of both knees Follows with orthopedics.  Request surgical clearance today. Patient has no risk factors and will be undergoing left knee replacement.  EKG today was normal.  His overall risk is low and he has no contraindications for proceeding with surgery. Clearance form was completed.  Migraine headache Stable.  No red flag signs or symptoms.  Continue over-the-counter analgesics as  needed.  Hyperglycemia Check A1c today.  Preventative Healthcare: Check lipid panel.  Patient will be getting colonoscopy later this year.  Patient Counseling(The following topics were reviewed and/or handout was given):  -Nutrition: Stressed importance of moderation in sodium/caffeine intake, saturated fat and cholesterol, caloric balance, sufficient intake of fresh fruits, vegetables, and fiber.  -Stressed the importance of regular exercise.   -Substance Abuse: Discussed cessation/primary prevention of tobacco, alcohol, or other drug use; driving or other dangerous activities under the influence; availability of treatment for abuse.   -Injury prevention: Discussed safety belts, safety helmets, smoke detector, smoking near bedding or upholstery.   -Sexuality: Discussed sexually transmitted diseases, partner selection, use of condoms, avoidance of unintended pregnancy and contraceptive alternatives.   -Dental health: Discussed importance of regular tooth brushing, flossing, and dental visits.  -Health maintenance and immunizations reviewed. Please refer to Health maintenance section.  Return to care in 1 year for next preventative visit.   Algis Greenhouse. Jerline Pain, MD 09/10/2017 10:00 AM

## 2017-09-10 NOTE — Assessment & Plan Note (Signed)
Stable.  No red flag signs or symptoms.  Continue over-the-counter analgesics as needed.

## 2017-09-11 DIAGNOSIS — M25611 Stiffness of right shoulder, not elsewhere classified: Secondary | ICD-10-CM | POA: Diagnosis not present

## 2017-09-13 DIAGNOSIS — M25611 Stiffness of right shoulder, not elsewhere classified: Secondary | ICD-10-CM | POA: Diagnosis not present

## 2017-09-18 DIAGNOSIS — M25611 Stiffness of right shoulder, not elsewhere classified: Secondary | ICD-10-CM | POA: Diagnosis not present

## 2017-09-19 ENCOUNTER — Ambulatory Visit: Payer: Self-pay | Admitting: Orthopedic Surgery

## 2017-09-25 DIAGNOSIS — M25611 Stiffness of right shoulder, not elsewhere classified: Secondary | ICD-10-CM | POA: Diagnosis not present

## 2017-09-27 DIAGNOSIS — M25611 Stiffness of right shoulder, not elsewhere classified: Secondary | ICD-10-CM | POA: Diagnosis not present

## 2017-10-02 DIAGNOSIS — M25611 Stiffness of right shoulder, not elsewhere classified: Secondary | ICD-10-CM | POA: Diagnosis not present

## 2017-10-03 DIAGNOSIS — M1712 Unilateral primary osteoarthritis, left knee: Secondary | ICD-10-CM | POA: Diagnosis not present

## 2017-10-03 NOTE — H&P (Addendum)
TOTAL KNEE ADMISSION H&P  Patient is being admitted for left total knee arthroplasty.  Subjective:  Chief Complaint:left knee pain.  HPI: Vincent Hernandez, 52 y.o. male, has a history of pain and functional disability in the left knee due to arthritis and has failed non-surgical conservative treatments for greater than 12 weeks to includecorticosteriod injections, viscosupplementation injections, flexibility and strengthening excercises, supervised PT with diminished ADL's post treatment, weight reduction as appropriate and activity modification.  Onset of symptoms was gradual, starting 4 years ago with gradually worsening course since that time. The patient noted prior procedures on the knee to include  arthroscopy on the left knee(s).  Patient currently rates pain in the left knee(s) at 6 out of 10 with activity. Patient has worsening of pain with activity and weight bearing, pain that interferes with activities of daily living, pain with passive range of motion and joint swelling.  Patient has evidence of subchondral sclerosis, periarticular osteophytes and joint space narrowing by imaging studies. There is no active infection.  Patient Active Problem List   Diagnosis Date Noted  . Tinnitus 09/10/2017  . Primary osteoarthritis of both knees 09/10/2017  . Dyslipidemia 09/10/2017  . S/P knee replacement 05/04/2017  . Migraine headache 08/14/2015  . Herniated disc, cervical 08/14/2015   Past Medical History:  Diagnosis Date  . Arthritis   . GERD (gastroesophageal reflux disease)    pt. denies  . Headache(784.0)    Migraines  . Herniated disc, cervical    through T1    Past Surgical History:  Procedure Laterality Date  . ANTERIOR CERVICAL DECOMP/DISCECTOMY FUSION N/A 09/01/2015   Procedure: ACDF C7-T1;  Surgeon: Melina Schools, MD;  Location: [facility];  Service: Orthopedics;  Laterality: N/A;  . BICEPS TENODESIS Right 06/19/2017   Dr. Theda Sers  . CARPAL TUNNEL RELEASE Bilateral   . FOOT  ARTHROPLASTY Right 2011   screws  . GANGLION CYST EXCISION    . KNEE ARTHROSCOPY Left 1/14  . ORIF TOE FRACTURE Left 11/21/2012   Procedure: OPEN REDUCTION INTERNAL FIXATION (ORIF) LEFT FOURTH METATARSAL NONUNION FRACTURE;  Surgeon: Wylene Simmer, MD;  Location: [facility];  Service: Orthopedics;  Laterality: Left;  . REPAIR QUADRICEPS / HAMSTRING MUSCLE Right   . ROTATOR CUFF REPAIR Right 06/19/2017   Dr. Theda Sers  . SEPTOPLASTY  1995  . SHOULDER ARTHROSCOPY W/ ROTATOR CUFF REPAIR Left 2013  . TOTAL KNEE ARTHROPLASTY Right 05/04/2017   Procedure: RIGHT TOTAL KNEE ARTHROPLASTY;  Surgeon: Sydnee Cabal, MD;  Location: [facility];  Service: Orthopedics;  Laterality: Right;  Adductor Block    No current facility-administered medications for this encounter.    Current Outpatient Medications  Medication Sig Dispense Refill Last Dose  . Echinacea 400 MG CAPS Take 800 mg by mouth daily.   Taking  . Glucosamine-MSM-Hyaluronic Acd (JOINT HEALTH PO) Take 2 tablets by mouth daily.   Taking  . Ibuprofen-Famotidine (DUEXIS) 800-26.6 MG TABS Take 1 tablet by mouth 3 (three) times daily as needed (pain).   Taking  . Multiple Vitamin (MULTIVITAMIN WITH MINERALS) TABS tablet Take 1 tablet by mouth daily.   Taking  . Omega-3 Fatty Acids (FISH OIL) 1000 MG CAPS Take 4,000 mg by mouth daily.   Taking  . pyridOXINE (VITAMIN B-6) 100 MG tablet Take 100 mg by mouth daily.   Taking  . SUPER B COMPLEX/C CAPS Take 1 capsule by mouth daily.   Taking  . vitamin C (ASCORBIC ACID) 500 MG tablet Take 500 mg by mouth daily.  Taking   Allergies  Allergen Reactions  . Robaxin [Methocarbamol] Itching  . Tdap [Tetanus-Diphth-Acell Pertussis] Other (See Comments)    "Locked up" aches and pains    Social History   Tobacco Use  . Smoking status: Former Research scientist (life sciences)  . Smokeless tobacco: Never Used  . 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 Alcohol use: No    Alcohol/week: 0.0 oz     Frequency: Never    Family History  Problem Relation Age of Onset  . Cancer Father        lung  . Stroke Father 34       1973     Review of Systems  Constitutional: Negative.   HENT: Negative.   Eyes: Negative.   Respiratory: Negative.   Cardiovascular: Negative.   Gastrointestinal: Negative.   Genitourinary: Negative.   Musculoskeletal: Positive for joint pain.  Skin: Negative.   Neurological: Negative.   Endo/Heme/Allergies: Negative.   Psychiatric/Behavioral: Negative.     Objective:  Physical Exam  Constitutional: He is oriented to person, place, and time. He appears well-developed.  HENT:  Head: Normocephalic.  Eyes: EOM are normal.  Neck: Normal range of motion.  Cardiovascular: Normal rate and intact distal pulses.  Respiratory: Effort normal.  Genitourinary:  Genitourinary Comments: Deferred  Musculoskeletal:  Medial and lateral pain. Limited ROM. Knee is stable. Calf soft. Sensation intact to touch.  Neurological: He is alert and oriented to person, place, and time.  Skin: Skin is warm and dry.  Psychiatric: His behavior is normal.    Vital signs in last 24 hours: BP: ()/()  Arterial Line BP: ()/()   Labs:   Estimated body mass index is 35.44 kg/m as calculated from the following:   Height as of 09/10/17: 5\' 9"  (1.753 m).   Weight as of 09/10/17: 108.9 kg (240 lb).   Imaging Review Plain radiographs demonstrate moderate degenerative joint disease of the left knee(s). The overall alignment ismild varus. The bone quality appears to be good for age and reported activity level.   Preoperative templating of the joint replacement has been completed, documented, and submitted to the Operating Room personnel in order to optimize intra-operative equipment management.   Anticipated LOS equal to or greater than 2 midnights due to - Age 88 and older with one or more of the following:  - Obesity  - Expected need for hospital services (PT, OT, Nursing)  required for safe  discharge  - Anticipated need for postoperative skilled nursing care or inpatient rehab  - Active co-morbidities: None OR   - Unanticipated findings during/Post Surgery: None  - Patient is a high risk of re-admission due to: None     Assessment/Plan:  End stage arthritis, left knee   The patient history, physical examination, clinical judgment of the provider and imaging studies are consistent with end stage degenerative joint disease of the left knee(s) and total knee arthroplasty is deemed medically necessary. The treatment options including medical management, injection therapy arthroscopy and arthroplasty were discussed at length. The risks and benefits of total knee arthroplasty were presented and reviewed. The risks due to aseptic loosening, infection, stiffness, patella tracking problems, thromboembolic complications and other imponderables were discussed. The patient acknowledged the explanation, agreed to proceed with the plan and consent was signed. Patient is being admitted for inpatient treatment for surgery, pain control, PT, OT, prophylactic antibiotics, VTE prophylaxis, progressive ambulation and ADL's and discharge planning. The patient is planning to be discharged  home with OPPT. H&P updated since Surgery was rescheduled.  Will use IV tranexamic acid. Contraindications and adverse affects of Tranexamic acid discussed in detail. Patient denies any of these at this time and understands the risks and benefits.

## 2017-10-04 DIAGNOSIS — M25611 Stiffness of right shoulder, not elsewhere classified: Secondary | ICD-10-CM | POA: Diagnosis not present

## 2017-10-09 DIAGNOSIS — M25611 Stiffness of right shoulder, not elsewhere classified: Secondary | ICD-10-CM | POA: Diagnosis not present

## 2017-10-18 ENCOUNTER — Inpatient Hospital Stay (HOSPITAL_COMMUNITY): Admission: RE | Admit: 2017-10-18 | Payer: BLUE CROSS/BLUE SHIELD | Source: Ambulatory Visit

## 2017-10-18 DIAGNOSIS — M25611 Stiffness of right shoulder, not elsewhere classified: Secondary | ICD-10-CM | POA: Diagnosis not present

## 2017-10-23 DIAGNOSIS — M25611 Stiffness of right shoulder, not elsewhere classified: Secondary | ICD-10-CM | POA: Diagnosis not present

## 2017-10-24 ENCOUNTER — Ambulatory Visit: Payer: Self-pay | Admitting: Orthopedic Surgery

## 2017-10-25 DIAGNOSIS — M25611 Stiffness of right shoulder, not elsewhere classified: Secondary | ICD-10-CM | POA: Diagnosis not present

## 2017-11-01 DIAGNOSIS — M25611 Stiffness of right shoulder, not elsewhere classified: Secondary | ICD-10-CM | POA: Diagnosis not present

## 2017-11-05 ENCOUNTER — Encounter: Payer: Self-pay | Admitting: Family Medicine

## 2017-11-05 NOTE — Patient Instructions (Addendum)
Vincent Hernandez  11/05/2017   Your procedure is scheduled on: 11-16-17   Report to [facility] Main  Entrance    Report to Admitting at 7:15 AM    Call this number if you have problems the morning of surgery [phone]   Remember: Do not eat food or drink liquids :After Midnight.     Take these medicines the morning of surgery with A SIP OF WATER: You may take your Sumatriptan (Imitrex), and pain medication as needed.                                You may not have any metal on your body including hair pins and              piercings  Do not wear jewelry, lotions, powders, cologne or deodorant             Men may shave face and neck.   Do not bring valuables to the hospital. Vincent Hernandez.  Contacts, dentures or bridgework may not be worn into surgery.  Leave suitcase in the car. After surgery it may be brought to your room.      Special Instructions: N/A              Please read over the following fact sheets you were given: _____________________________________________________________________          [facility] - Preparing for Surgery Before surgery, you can play an important role.  Because skin is not sterile, your skin needs to be as free of germs as possible.  You can reduce the number of germs on your skin by washing with CHG (chlorahexidine gluconate) soap before surgery.  CHG is an antiseptic cleaner which kills germs and bonds with the skin to continue killing germs even after washing. Please DO NOT use if you have an allergy to CHG or antibacterial soaps.  If your skin becomes reddened/irritated stop using the CHG and inform your nurse when you arrive at Short Stay. Do not shave (including legs and underarms) for at least 48 hours prior to the first CHG shower.  You may shave your face/neck. Please follow these instructions carefully:  1.  Shower with CHG Soap the night before surgery and the   morning of Surgery.  2.  If you choose to wash your hair, wash your hair first as usual with your  normal  shampoo.  3.  After you shampoo, rinse your hair and body thoroughly to remove the  shampoo.                           4.  Use CHG as you would any other liquid soap.  You can apply chg directly  to the skin and wash                       Gently with a scrungie or clean washcloth.  5.  Apply the CHG Soap to your body ONLY FROM THE NECK DOWN.   Do not use on face/ open  Wound or open sores. Avoid contact with eyes, ears mouth and genitals (private parts).                       Wash face,  Genitals (private parts) with your normal soap.             6.  Wash thoroughly, paying special attention to the area where your surgery  will be performed.  7.  Thoroughly rinse your body with warm water from the neck down.  8.  DO NOT shower/wash with your normal soap after using and rinsing off  the CHG Soap.                9.  Pat yourself dry with a clean towel.            10.  Wear clean pajamas.            11.  Place clean sheets on your bed the night of your first shower and do not  sleep with pets. Day of Surgery : Do not apply any lotions/deodorants the morning of surgery.  Please wear clean clothes to the hospital/surgery center.  FAILURE TO FOLLOW THESE INSTRUCTIONS MAY RESULT IN THE CANCELLATION OF YOUR SURGERY PATIENT SIGNATURE_________________________________  NURSE SIGNATURE__________________________________  ________________________________________________________________________   Vincent Hernandez  An incentive spirometer is a tool that can help keep your lungs clear and active. This tool measures how well you are filling your lungs with each breath. Taking long deep breaths may help reverse or decrease the chance of developing breathing (pulmonary) problems (especially infection) following:  A long period of time when you are unable to move or be  active. BEFORE THE PROCEDURE   If the spirometer includes an indicator to show your best effort, your nurse or respiratory therapist will set it to a desired goal.  If possible, sit up straight or lean slightly forward. Try not to slouch.  Hold the incentive spirometer in an upright position. INSTRUCTIONS FOR USE  1. Sit on the edge of your bed if possible, or sit up as far as you can in bed or on a chair. 2. Hold the incentive spirometer in an upright position. 3. Breathe out normally. 4. Place the mouthpiece in your mouth and seal your lips tightly around it. 5. Breathe in slowly and as deeply as possible, raising the piston or the ball toward the top of the column. 6. Hold your breath for 3-5 seconds or for as long as possible. Allow the piston or ball to fall to the bottom of the column. 7. Remove the mouthpiece from your mouth and breathe out normally. 8. Rest for a few seconds and repeat Steps 1 through 7 at least 10 times every 1-2 hours when you are awake. Take your time and take a few normal breaths between deep breaths. 9. The spirometer may include an indicator to show your best effort. Use the indicator as a goal to work toward during each repetition. 10. After each set of 10 deep breaths, practice coughing to be sure your lungs are clear. If you have an incision (the cut made at the time of surgery), support your incision when coughing by placing a pillow or rolled up towels firmly against it. Once you are able to get out of bed, walk around indoors and cough well. You may stop using the incentive spirometer when instructed by your caregiver.  RISKS AND COMPLICATIONS  Take your time so you do not get  dizzy or light-headed.  If you are in pain, you may need to take or ask for pain medication before doing incentive spirometry. It is harder to take a deep breath if you are having pain. AFTER USE  Rest and breathe slowly and easily.  It can be helpful to keep track of a log of  your progress. Your caregiver can provide you with a simple table to help with this. If you are using the spirometer at home, follow these instructions: [place] IF:   You are having difficultly using the spirometer.  You have trouble using the spirometer as often as instructed.  Your pain medication is not giving enough relief while using the spirometer.  You develop fever of 100.5 F (38.1 C) or higher. SEEK IMMEDIATE MEDICAL CARE IF:   You cough up bloody sputum that had not been present before.  You develop fever of 102 F (38.9 C) or greater.  You develop worsening pain at or near the incision site. MAKE SURE YOU:   Understand these instructions.  Will watch your condition.  Will get help right away if you are not doing well or get worse. Document Released: 06/26/2006 Document Revised: 05/08/2011 Document Reviewed: 08/27/2006 [employer] Patient Information 2014 [employer], [place].   ________________________________________________________________________  WHAT IS A BLOOD TRANSFUSION? Blood Transfusion Information  A transfusion is the replacement of blood or some of its parts. Blood is made up of multiple cells which provide different functions.  Red blood cells carry oxygen and are used for blood loss replacement.  White blood cells fight against infection.  Platelets control bleeding.  Plasma helps clot blood.  Other blood products are available for specialized needs, such as hemophilia or other clotting disorders. BEFORE THE TRANSFUSION  Who gives blood for transfusions?   Healthy volunteers who are fully evaluated to make sure their blood is safe. This is blood bank blood. Transfusion therapy is the safest it has ever been in the practice of medicine. Before blood is taken from a donor, a complete history is taken to make sure that person has no history of diseases nor engages in risky social behavior (examples are intravenous drug use or sexual activity  with multiple partners). The donor's travel history is screened to minimize risk of transmitting infections, such as malaria. The donated blood is tested for signs of infectious diseases, such as HIV and hepatitis. The blood is then tested to be sure it is compatible with you in order to minimize the chance of a transfusion reaction. If you or a relative donates blood, this is often done in anticipation of surgery and is not appropriate for emergency situations. It takes many days to process the donated blood. RISKS AND COMPLICATIONS Although transfusion therapy is very safe and saves many lives, the main dangers of transfusion include:   Getting an infectious disease.  Developing a transfusion reaction. This is an allergic reaction to something in the blood you were given. Every precaution is taken to prevent this. The decision to have a blood transfusion has been considered carefully by your caregiver before blood is given. Blood is not given unless the benefits outweigh the risks. AFTER THE TRANSFUSION  Right after receiving a blood transfusion, you will usually feel much better and more energetic. This is especially true if your red blood cells have gotten low (anemic). The transfusion raises the level of the red blood cells which carry oxygen, and this usually causes an energy increase.  The nurse administering the transfusion will  monitor you carefully for complications. HOME CARE INSTRUCTIONS  No special instructions are needed after a transfusion. You may find your energy is better. Speak with your caregiver about any limitations on activity for underlying diseases you may have. SEEK MEDICAL CARE IF:   Your condition is not improving after your transfusion.  You develop redness or irritation at the intravenous (IV) site. SEEK IMMEDIATE MEDICAL CARE IF:  Any of the following symptoms occur over the next 12 hours:  Shaking chills.  You have a temperature by mouth above 102 F (38.9  C), not controlled by medicine.  Chest, back, or muscle pain.  People around you feel you are not acting correctly or are confused.  Shortness of breath or difficulty breathing.  Dizziness and fainting.  You get a rash or develop hives.  You have a decrease in urine output.  Your urine turns a dark color or changes to pink, red, or brown. Any of the following symptoms occur over the next 10 days:  You have a temperature by mouth above 102 F (38.9 C), not controlled by medicine.  Shortness of breath.  Weakness after normal activity.  The white part of the eye turns yellow (jaundice).  You have a decrease in the amount of urine or are urinating less often.  Your urine turns a dark color or changes to pink, red, or brown. Document Released: 02/11/2000 Document Revised: 05/08/2011 Document Reviewed: 09/30/2007 [facility] Patient Information 2014 [place], [place].  _______________________________________________________________________

## 2017-11-05 NOTE — Progress Notes (Addendum)
09-10-17 (Epic) EKG, and surgical clearance from Dr. Jerline Pain on chart

## 2017-11-06 MED ORDER — SUMATRIPTAN SUCCINATE 25 MG PO TABS: 25.0000 mg | ORAL_TABLET | ORAL | 0 refills | Status: DC | PRN

## 2017-11-08 ENCOUNTER — Other Ambulatory Visit: Payer: Self-pay

## 2017-11-08 ENCOUNTER — Encounter (HOSPITAL_COMMUNITY)
Admission: RE | Admit: 2017-11-08 | Discharge: 2017-11-08 | Disposition: A | Discharge status: Home / Self Care | Payer: BLUE CROSS/BLUE SHIELD | Source: Ambulatory Visit | Attending: Specialist | Admitting: Specialist

## 2017-11-08 ENCOUNTER — Encounter (HOSPITAL_COMMUNITY): Payer: Self-pay

## 2017-11-08 DIAGNOSIS — Z01812 Encounter for preprocedural laboratory examination: Secondary | ICD-10-CM | POA: Diagnosis not present

## 2017-11-08 DIAGNOSIS — M1712 Unilateral primary osteoarthritis, left knee: Secondary | ICD-10-CM | POA: Diagnosis not present

## 2017-11-08 LAB — BASIC METABOLIC PANEL
ANION GAP: 11 (ref 5–15)
BUN: 19 mg/dL (ref 6–20)
CALCIUM: 9.3 mg/dL (ref 8.9–10.3)
CO2: 28 mmol/L (ref 22–32)
Chloride: 105 mmol/L (ref 98–111)
Creatinine, Ser: 0.77 mg/dL (ref 0.61–1.24)
GFR calc Af Amer: >60 mL/min (ref >60)
Glucose, Bld: 102 mg/dL — ABNORMAL HIGH (ref 70–99)
Potassium: 4.1 mmol/L (ref 3.5–5.1)
SODIUM: 144 mmol/L (ref 135–145)

## 2017-11-08 LAB — CBC
HCT: 43.1 % (ref 39.0–52.0)
HEMOGLOBIN: 15.3 g/dL (ref 13.0–17.0)
MCH: 32.6 pg (ref 26.0–34.0)
MCHC: 35.5 g/dL (ref 30.0–36.0)
MCV: 91.7 fL (ref 78.0–100.0)
Platelets: 270 10*3/uL (ref 150–400)
RBC: 4.7 MIL/uL (ref 4.22–5.81)
RDW: 12.9 % (ref 11.5–15.5)
WBC: 6.2 10*3/uL (ref 4.0–10.5)

## 2017-11-08 LAB — URINALYSIS, ROUTINE W REFLEX MICROSCOPIC
Bilirubin Urine: NEGATIVE
GLUCOSE, UA: NEGATIVE mg/dL
Hgb urine dipstick: NEGATIVE
KETONES UR: NEGATIVE mg/dL
LEUKOCYTES UA: NEGATIVE
Nitrite: NEGATIVE
PH: 5 (ref 5.0–8.0)
Protein, ur: NEGATIVE mg/dL
Specific Gravity, Urine: 1.017 (ref 1.005–1.030)

## 2017-11-08 LAB — PROTIME-INR
INR: 0.97
PROTHROMBIN TIME: 12.7 s (ref 11.4–15.2)

## 2017-11-08 LAB — SURGICAL PCR SCREEN
MRSA, PCR: NEGATIVE
Staphylococcus aureus: NEGATIVE

## 2017-11-08 LAB — HEMOGLOBIN A1C
HEMOGLOBIN A1C: 5 % (ref 4.8–5.6)
Mean Plasma Glucose: 96.8 mg/dL

## 2017-11-08 LAB — APTT: APTT: 32 s (ref 24–36)

## 2017-11-16 ENCOUNTER — Inpatient Hospital Stay (HOSPITAL_COMMUNITY): Payer: BLUE CROSS/BLUE SHIELD | Admitting: Anesthesiology

## 2017-11-16 ENCOUNTER — Other Ambulatory Visit: Payer: Self-pay

## 2017-11-16 ENCOUNTER — Inpatient Hospital Stay (HOSPITAL_COMMUNITY)
Admission: RE | Admit: 2017-11-16 | Discharge: 2017-11-17 | DRG: 470 | Disposition: A | Discharge status: Home / Self Care | Payer: BLUE CROSS/BLUE SHIELD | Source: Ambulatory Visit | Attending: Specialist | Admitting: Specialist

## 2017-11-16 ENCOUNTER — Encounter (HOSPITAL_COMMUNITY)
Admission: RE | Disposition: A | Discharge status: Home / Self Care | Payer: Self-pay | Source: Ambulatory Visit | Attending: Specialist

## 2017-11-16 ENCOUNTER — Encounter (HOSPITAL_COMMUNITY): Payer: Self-pay

## 2017-11-16 DIAGNOSIS — K219 Gastro-esophageal reflux disease without esophagitis: Secondary | ICD-10-CM | POA: Diagnosis present

## 2017-11-16 DIAGNOSIS — Z6835 Body mass index (BMI) 35.0-35.9, adult: Secondary | ICD-10-CM

## 2017-11-16 DIAGNOSIS — E785 Hyperlipidemia, unspecified: Secondary | ICD-10-CM | POA: Diagnosis present

## 2017-11-16 DIAGNOSIS — Z79899 Other long term (current) drug therapy: Secondary | ICD-10-CM | POA: Diagnosis not present

## 2017-11-16 DIAGNOSIS — Z888 Allergy status to other drugs, medicaments and biological substances status: Secondary | ICD-10-CM

## 2017-11-16 DIAGNOSIS — Z96659 Presence of unspecified artificial knee joint: Secondary | ICD-10-CM

## 2017-11-16 DIAGNOSIS — Z87891 Personal history of nicotine dependence: Secondary | ICD-10-CM

## 2017-11-16 DIAGNOSIS — Z981 Arthrodesis status: Secondary | ICD-10-CM | POA: Diagnosis not present

## 2017-11-16 DIAGNOSIS — Z96651 Presence of right artificial knee joint: Secondary | ICD-10-CM | POA: Diagnosis not present

## 2017-11-16 DIAGNOSIS — M1712 Unilateral primary osteoarthritis, left knee: Principal | ICD-10-CM | POA: Diagnosis present

## 2017-11-16 DIAGNOSIS — G8918 Other acute postprocedural pain: Secondary | ICD-10-CM | POA: Diagnosis not present

## 2017-11-16 HISTORY — PX: TOTAL KNEE ARTHROPLASTY: SHX125

## 2017-11-16 LAB — TYPE AND SCREEN
ABO/RH(D): A POS
ANTIBODY SCREEN: NEGATIVE

## 2017-11-16 SURGERY — ARTHROPLASTY, KNEE, TOTAL
Anesthesia: Spinal | Site: Knee | Laterality: Left

## 2017-11-16 MED ORDER — PROMETHAZINE HCL 25 MG/ML IJ SOLN: 6.2500 mg | INTRAMUSCULAR | Status: DC | PRN

## 2017-11-16 MED ORDER — PHENOL 1.4 % MT LIQD: 1.0000 | OROMUCOSAL | Status: DC | PRN

## 2017-11-16 MED ORDER — KETOROLAC TROMETHAMINE 30 MG/ML IJ SOLN
INTRAMUSCULAR | Status: DC | PRN
  Administered 2017-11-16: 30 mg via INTRAMUSCULAR

## 2017-11-16 MED ORDER — ENOXAPARIN SODIUM 30 MG/0.3ML ~~LOC~~ SOLN
30.0000 mg | Freq: Two times a day (BID) | SUBCUTANEOUS | Status: DC
  Administered 2017-11-17: 30 mg via SUBCUTANEOUS
  Filled 2017-11-16: qty 0.3

## 2017-11-16 MED ORDER — SODIUM CHLORIDE 0.9 % IJ SOLN
INTRAMUSCULAR | Status: AC
  Filled 2017-11-16: qty 50

## 2017-11-16 MED ORDER — BUPIVACAINE-EPINEPHRINE 0.25% -1:200000 IJ SOLN
INTRAMUSCULAR | Status: DC | PRN
  Administered 2017-11-16: 30 mL

## 2017-11-16 MED ORDER — HYDROCODONE-ACETAMINOPHEN 5-325 MG PO TABS
1.0000 | ORAL_TABLET | Freq: Four times a day (QID) | ORAL | Status: DC | PRN
  Administered 2017-11-16: 1 via ORAL
  Filled 2017-11-16: qty 1

## 2017-11-16 MED ORDER — METOCLOPRAMIDE HCL 5 MG PO TABS: 5.0000 mg | ORAL_TABLET | Freq: Three times a day (TID) | ORAL | Status: DC | PRN

## 2017-11-16 MED ORDER — POLYETHYLENE GLYCOL 3350 17 G PO PACK: 17.0000 g | PACK | Freq: Every day | ORAL | Status: DC | PRN

## 2017-11-16 MED ORDER — DEXAMETHASONE SODIUM PHOSPHATE 10 MG/ML IJ SOLN
10.0000 mg | Freq: Once | INTRAMUSCULAR | Status: AC
  Administered 2017-11-17: 10 mg via INTRAVENOUS

## 2017-11-16 MED ORDER — ONDANSETRON HCL 4 MG/2ML IJ SOLN: 4.0000 mg | Freq: Four times a day (QID) | INTRAMUSCULAR | Status: DC | PRN

## 2017-11-16 MED ORDER — MIDAZOLAM HCL 2 MG/2ML IJ SOLN
1.0000 mg | INTRAMUSCULAR | Status: DC | PRN
  Administered 2017-11-16: 2 mg via INTRAVENOUS

## 2017-11-16 MED ORDER — TRANEXAMIC ACID 1000 MG/10ML IV SOLN
INTRAVENOUS | Status: AC
  Filled 2017-11-16: qty 10

## 2017-11-16 MED ORDER — PROPOFOL 500 MG/50ML IV EMUL
INTRAVENOUS | Status: DC | PRN
  Administered 2017-11-16: 100 ug/kg/min via INTRAVENOUS

## 2017-11-16 MED ORDER — ONDANSETRON HCL 4 MG/2ML IJ SOLN
INTRAMUSCULAR | Status: DC | PRN
  Administered 2017-11-16: 4 mg via INTRAVENOUS

## 2017-11-16 MED ORDER — TRANEXAMIC ACID 1000 MG/10ML IV SOLN
1000.0000 mg | INTRAVENOUS | Status: AC
  Administered 2017-11-16: 1000 mg via INTRAVENOUS

## 2017-11-16 MED ORDER — PHENYLEPHRINE 40 MCG/ML (10ML) SYRINGE FOR IV PUSH (FOR BLOOD PRESSURE SUPPORT)
PREFILLED_SYRINGE | INTRAVENOUS | Status: AC
  Filled 2017-11-16: qty 20

## 2017-11-16 MED ORDER — DEXAMETHASONE SODIUM PHOSPHATE 10 MG/ML IJ SOLN
10.0000 mg | Freq: Once | INTRAMUSCULAR | Status: DC
  Filled 2017-11-16: qty 1

## 2017-11-16 MED ORDER — ASPIRIN EC 325 MG PO TBEC: 325.0000 mg | DELAYED_RELEASE_TABLET | Freq: Two times a day (BID) | ORAL | 3 refills | Status: AC

## 2017-11-16 MED ORDER — ROPIVACAINE HCL 5 MG/ML IJ SOLN
INTRAMUSCULAR | Status: DC | PRN
  Administered 2017-11-16: 30 mL via PERINEURAL

## 2017-11-16 MED ORDER — PROPOFOL 10 MG/ML IV BOLUS
INTRAVENOUS | Status: AC
  Filled 2017-11-16: qty 60

## 2017-11-16 MED ORDER — BUPIVACAINE-EPINEPHRINE (PF) 0.25% -1:200000 IJ SOLN
INTRAMUSCULAR | Status: AC
  Filled 2017-11-16: qty 30

## 2017-11-16 MED ORDER — 0.9 % SODIUM CHLORIDE (POUR BTL) OPTIME
TOPICAL | Status: DC | PRN
  Administered 2017-11-16: 1000 mL

## 2017-11-16 MED ORDER — KETOROLAC TROMETHAMINE 30 MG/ML IJ SOLN
INTRAMUSCULAR | Status: AC
  Filled 2017-11-16: qty 1

## 2017-11-16 MED ORDER — CEFAZOLIN SODIUM-DEXTROSE 2-4 GM/100ML-% IV SOLN
INTRAVENOUS | Status: AC
  Filled 2017-11-16: qty 100

## 2017-11-16 MED ORDER — PROPOFOL 10 MG/ML IV BOLUS
INTRAVENOUS | Status: AC
  Filled 2017-11-16: qty 20

## 2017-11-16 MED ORDER — MENTHOL 3 MG MT LOZG: 1.0000 | LOZENGE | OROMUCOSAL | Status: DC | PRN

## 2017-11-16 MED ORDER — DEXAMETHASONE SODIUM PHOSPHATE 10 MG/ML IJ SOLN
INTRAMUSCULAR | Status: AC
  Filled 2017-11-16: qty 1

## 2017-11-16 MED ORDER — BISACODYL 5 MG PO TBEC: 5.0000 mg | DELAYED_RELEASE_TABLET | Freq: Every day | ORAL | Status: DC | PRN

## 2017-11-16 MED ORDER — MIDAZOLAM HCL 2 MG/2ML IJ SOLN
INTRAMUSCULAR | Status: AC
  Filled 2017-11-16: qty 2

## 2017-11-16 MED ORDER — LIDOCAINE 2% (20 MG/ML) 5 ML SYRINGE
INTRAMUSCULAR | Status: AC
  Filled 2017-11-16: qty 5

## 2017-11-16 MED ORDER — ACETAMINOPHEN 325 MG PO TABS: 325.0000 mg | ORAL_TABLET | Freq: Four times a day (QID) | ORAL | Status: DC | PRN

## 2017-11-16 MED ORDER — ONDANSETRON HCL 4 MG PO TABS: 4.0000 mg | ORAL_TABLET | Freq: Four times a day (QID) | ORAL | Status: DC | PRN

## 2017-11-16 MED ORDER — SUMATRIPTAN SUCCINATE 25 MG PO TABS
25.0000 mg | ORAL_TABLET | ORAL | Status: DC | PRN
  Filled 2017-11-16: qty 1

## 2017-11-16 MED ORDER — PROPOFOL 10 MG/ML IV BOLUS
INTRAVENOUS | Status: DC | PRN
  Administered 2017-11-16: 30 mg via INTRAVENOUS
  Administered 2017-11-16: 20 mg via INTRAVENOUS

## 2017-11-16 MED ORDER — EPHEDRINE 5 MG/ML INJ
INTRAVENOUS | Status: AC
  Filled 2017-11-16: qty 10

## 2017-11-16 MED ORDER — OXYCODONE HCL 5 MG PO TABS: 5.0000 mg | ORAL_TABLET | ORAL | 0 refills | Status: DC | PRN

## 2017-11-16 MED ORDER — FERROUS SULFATE 325 (65 FE) MG PO TABS
325.0000 mg | ORAL_TABLET | Freq: Three times a day (TID) | ORAL | Status: DC
  Administered 2017-11-17: 325 mg via ORAL
  Filled 2017-11-16: qty 1

## 2017-11-16 MED ORDER — LIDOCAINE HCL (CARDIAC) PF 100 MG/5ML IV SOSY
PREFILLED_SYRINGE | INTRAVENOUS | Status: DC | PRN
  Administered 2017-11-16: 60 mg via INTRAVENOUS

## 2017-11-16 MED ORDER — CEFAZOLIN SODIUM-DEXTROSE 2-4 GM/100ML-% IV SOLN
2.0000 g | Freq: Four times a day (QID) | INTRAVENOUS | Status: AC
  Administered 2017-11-16 (×2): 2 g via INTRAVENOUS
  Filled 2017-11-16 (×2): qty 100

## 2017-11-16 MED ORDER — HYDROCODONE-ACETAMINOPHEN 5-325 MG PO TABS: 1.0000 | ORAL_TABLET | Freq: Four times a day (QID) | ORAL | Status: DC | PRN

## 2017-11-16 MED ORDER — HYDROMORPHONE HCL 1 MG/ML IJ SOLN: 0.2500 mg | INTRAMUSCULAR | Status: DC | PRN

## 2017-11-16 MED ORDER — SODIUM CHLORIDE 0.9 % IJ SOLN
INTRAMUSCULAR | Status: DC | PRN
  Administered 2017-11-16: 30 mL

## 2017-11-16 MED ORDER — CHLORHEXIDINE GLUCONATE 4 % EX LIQD: 60.0000 mL | Freq: Once | CUTANEOUS | Status: DC

## 2017-11-16 MED ORDER — LACTATED RINGERS IV SOLN
INTRAVENOUS | Status: AC
  Administered 2017-11-16 (×2): via INTRAVENOUS

## 2017-11-16 MED ORDER — OXYCODONE HCL 5 MG PO TABS: 10.0000 mg | ORAL_TABLET | ORAL | Status: DC | PRN

## 2017-11-16 MED ORDER — ALUM & MAG HYDROXIDE-SIMETH 200-200-20 MG/5ML PO SUSP: 30.0000 mL | ORAL | Status: DC | PRN

## 2017-11-16 MED ORDER — BUPIVACAINE IN DEXTROSE 0.75-8.25 % IT SOLN
INTRATHECAL | Status: DC | PRN
  Administered 2017-11-16: 1.6 mL via INTRATHECAL

## 2017-11-16 MED ORDER — DOCUSATE SODIUM 100 MG PO CAPS
100.0000 mg | ORAL_CAPSULE | Freq: Two times a day (BID) | ORAL | Status: DC
  Administered 2017-11-16 – 2017-11-17 (×2): 100 mg via ORAL
  Filled 2017-11-16 (×2): qty 1

## 2017-11-16 MED ORDER — SODIUM CHLORIDE 0.9 % IV SOLN
INTRAVENOUS | Status: DC
  Administered 2017-11-16: 75 mL/h via INTRAVENOUS
  Administered 2017-11-17: 06:00:00 via INTRAVENOUS

## 2017-11-16 MED ORDER — ACETAMINOPHEN 500 MG PO TABS
1000.0000 mg | ORAL_TABLET | Freq: Four times a day (QID) | ORAL | Status: AC
  Administered 2017-11-16 – 2017-11-17 (×2): 1000 mg via ORAL
  Filled 2017-11-16 (×2): qty 2

## 2017-11-16 MED ORDER — OXYCODONE HCL 5 MG PO TABS: 5.0000 mg | ORAL_TABLET | ORAL | Status: DC | PRN

## 2017-11-16 MED ORDER — ONDANSETRON HCL 4 MG/2ML IJ SOLN
INTRAMUSCULAR | Status: AC
  Filled 2017-11-16: qty 2

## 2017-11-16 MED ORDER — SODIUM CHLORIDE 0.9 % IR SOLN
Status: DC | PRN
  Administered 2017-11-16: 1000 mL

## 2017-11-16 MED ORDER — CEFAZOLIN SODIUM-DEXTROSE 2-4 GM/100ML-% IV SOLN
2.0000 g | INTRAVENOUS | Status: AC
  Administered 2017-11-16: 2 g via INTRAVENOUS

## 2017-11-16 MED ORDER — DEXAMETHASONE SODIUM PHOSPHATE 10 MG/ML IJ SOLN
INTRAMUSCULAR | Status: DC | PRN
  Administered 2017-11-16: 10 mg via INTRAVENOUS

## 2017-11-16 MED ORDER — CYCLOBENZAPRINE HCL 10 MG PO TABS
10.0000 mg | ORAL_TABLET | Freq: Three times a day (TID) | ORAL | Status: DC | PRN
  Administered 2017-11-16: 10 mg via ORAL
  Filled 2017-11-16 (×2): qty 1

## 2017-11-16 MED ORDER — HYDROMORPHONE HCL 1 MG/ML IJ SOLN: 0.5000 mg | INTRAMUSCULAR | Status: DC | PRN

## 2017-11-16 MED ORDER — FENTANYL CITRATE (PF) 100 MCG/2ML IJ SOLN
50.0000 ug | INTRAMUSCULAR | Status: DC | PRN
  Administered 2017-11-16: 100 ug via INTRAVENOUS

## 2017-11-16 MED ORDER — MAGNESIUM CITRATE PO SOLN: 1.0000 | Freq: Once | ORAL | Status: DC | PRN

## 2017-11-16 MED ORDER — FENTANYL CITRATE (PF) 100 MCG/2ML IJ SOLN
INTRAMUSCULAR | Status: AC
  Filled 2017-11-16: qty 2

## 2017-11-16 MED ORDER — PHENYLEPHRINE 40 MCG/ML (10ML) SYRINGE FOR IV PUSH (FOR BLOOD PRESSURE SUPPORT)
PREFILLED_SYRINGE | INTRAVENOUS | Status: DC | PRN
  Administered 2017-11-16: 80 ug via INTRAVENOUS

## 2017-11-16 MED ORDER — DIPHENHYDRAMINE HCL 12.5 MG/5ML PO ELIX: 12.5000 mg | ORAL_SOLUTION | ORAL | Status: DC | PRN

## 2017-11-16 MED ORDER — METOCLOPRAMIDE HCL 5 MG/ML IJ SOLN: 5.0000 mg | Freq: Three times a day (TID) | INTRAMUSCULAR | Status: DC | PRN

## 2017-11-16 SURGICAL SUPPLY — 62 items
BAG DECANTER FOR FLEXI CONT (MISCELLANEOUS) IMPLANT
BAG ZIPLOCK 12X15 (MISCELLANEOUS) ×2 IMPLANT
BANDAGE ACE 4X5 VEL STRL LF (GAUZE/BANDAGES/DRESSINGS) ×2 IMPLANT
BANDAGE ACE 6X5 VEL STRL LF (GAUZE/BANDAGES/DRESSINGS) ×2 IMPLANT
BLADE SAG 18X100X1.27 (BLADE) ×2 IMPLANT
BLADE SAW SGTL 13.0X1.19X90.0M (BLADE) ×2 IMPLANT
BOWL SMART MIX CTS (DISPOSABLE) ×2 IMPLANT
CEMENT HV SMART SET (Cement) ×4 IMPLANT
CEMENT TIBIA MBT SIZE 4 (Knees) ×1 IMPLANT
COVER SURGICAL LIGHT HANDLE (MISCELLANEOUS) ×2 IMPLANT
CUFF TOURN SGL QUICK 34 (TOURNIQUET CUFF) ×1
CUFF TRNQT CYL 34X4X40X1 (TOURNIQUET CUFF) ×1 IMPLANT
DECANTER SPIKE VIAL GLASS SM (MISCELLANEOUS) ×4 IMPLANT
DERMABOND ADVANCED (GAUZE/BANDAGES/DRESSINGS) ×1
DERMABOND ADVANCED .7 DNX12 (GAUZE/BANDAGES/DRESSINGS) ×1 IMPLANT
DRAPE U-SHAPE 47X51 STRL (DRAPES) ×2 IMPLANT
DRESSING AQUACEL AG SP 3.5X10 (GAUZE/BANDAGES/DRESSINGS) ×1 IMPLANT
DRSG AQUACEL AG ADV 3.5X10 (GAUZE/BANDAGES/DRESSINGS) IMPLANT
DRSG AQUACEL AG SP 3.5X10 (GAUZE/BANDAGES/DRESSINGS) ×2
DRSG TEGADERM 4X4.75 (GAUZE/BANDAGES/DRESSINGS) ×2 IMPLANT
DURAPREP 26ML APPLICATOR (WOUND CARE) ×4 IMPLANT
ELECT REM PT RETURN 15FT ADLT (MISCELLANEOUS) ×2 IMPLANT
EVACUATOR 1/8 PVC DRAIN (DRAIN) ×2 IMPLANT
GAUZE SPONGE 2X2 8PLY STRL LF (GAUZE/BANDAGES/DRESSINGS) ×1 IMPLANT
GLOVE BIO SURGEON STRL SZ7.5 (GLOVE) ×6 IMPLANT
GLOVE BIOGEL PI IND STRL 8 (GLOVE) ×3 IMPLANT
GLOVE BIOGEL PI INDICATOR 8 (GLOVE) ×3
GLOVE ECLIPSE 8.0 STRL XLNG CF (GLOVE) ×6 IMPLANT
GLOVE SURG ORTHO 9.0 STRL STRW (GLOVE) ×2 IMPLANT
GOWN STRL REUS W/TWL XL LVL3 (GOWN DISPOSABLE) ×4 IMPLANT
HANDPIECE INTERPULSE COAX TIP (DISPOSABLE) ×1
HOLDER FOLEY CATH W/STRAP (MISCELLANEOUS) ×2 IMPLANT
IMMOBILIZER KNEE 20 (SOFTGOODS) ×2
IMMOBILIZER KNEE 20 THIGH 36 (SOFTGOODS) ×1 IMPLANT
IMPL FEMUR SIGMA LT PS SZ 3 (Knees) ×1 IMPLANT
IMPLANT FEMUR SIGMA LT PS SZ 3 (Knees) ×2 IMPLANT
INSERT TIBIAL PFC SIG SZ3 10MM (Knees) ×2 IMPLANT
NS IRRIG 1000ML POUR BTL (IV SOLUTION) ×2 IMPLANT
PACK TOTAL KNEE CUSTOM (KITS) ×2 IMPLANT
PATELLA DOME PFC 38MM (Knees) ×2 IMPLANT
POSITIONER SURGICAL ARM (MISCELLANEOUS) ×2 IMPLANT
SET HNDPC FAN SPRY TIP SCT (DISPOSABLE) ×1 IMPLANT
SET PAD KNEE POSITIONER (MISCELLANEOUS) ×2 IMPLANT
SPONGE GAUZE 2X2 STER 10/PKG (GAUZE/BANDAGES/DRESSINGS) ×1
SPONGE LAP 18X18 RF (DISPOSABLE) IMPLANT
SPONGE SURGIFOAM ABS GEL 100 (HEMOSTASIS) ×2 IMPLANT
STOCKINETTE 6  STRL (DRAPES) ×1
STOCKINETTE 6 STRL (DRAPES) ×1 IMPLANT
SUT BONE WAX W31G (SUTURE) IMPLANT
SUT MNCRL AB 3-0 PS2 18 (SUTURE) ×2 IMPLANT
SUT VIC AB 1 CT1 27 (SUTURE) ×4
SUT VIC AB 1 CT1 27XBRD ANTBC (SUTURE) ×4 IMPLANT
SUT VIC AB 2-0 CT1 27 (SUTURE) ×2
SUT VIC AB 2-0 CT1 TAPERPNT 27 (SUTURE) ×2 IMPLANT
SUT VLOC 180 0 24IN GS25 (SUTURE) ×2 IMPLANT
SYR 50ML LL SCALE MARK (SYRINGE) ×2 IMPLANT
TAPE STRIPS DRAPE STRL (GAUZE/BANDAGES/DRESSINGS) ×2 IMPLANT
TIBIA MBT CEMENT SIZE 4 (Knees) ×2 IMPLANT
TRAY FOLEY MTR SLVR 16FR STAT (SET/KITS/TRAYS/PACK) ×2 IMPLANT
WATER STERILE IRR 1000ML POUR (IV SOLUTION) ×4 IMPLANT
WRAP KNEE MAXI GEL POST OP (GAUZE/BANDAGES/DRESSINGS) ×2 IMPLANT
YANKAUER SUCT BULB TIP 10FT TU (MISCELLANEOUS) ×2 IMPLANT

## 2017-11-16 NOTE — Op Note (Signed)
DATE OF SURGERY:  11/16/2017  TIME: 12:33 PM  PATIENT NAME:  Vincent Hernandez    AGE: 52 y.o.   PRE-OPERATIVE DIAGNOSIS:  Left knee osteoarthritis  POST-OPERATIVE DIAGNOSIS:  Left knee osteoarthritis  PROCEDURE:  Procedure(s): LEFT TOTAL KNEE ARTHROPLASTY  SURGEON:  Abelina Ketron ANDREW  ASSISTANT:  Bryson Stilwell, PA-C, present and scrubbed throughout the case, critical for assistance with exposure, retraction, instrumentation, and closure.  OPERATIVE IMPLANTS: Depuy PFC Sigma Rotating Platform.  Femur size 3, Tibia size 4, Patella size 38 3-peg oval button, with a 10 mm polyethylene insert.   PREOPERATIVE INDICATIONS:   Vincent Hernandez is a 52 y.o. year old male with end stage bone on bone arthritis of the knee who failed conservative treatment and elected for Total Knee Arthroplasty.   The risks, benefits, and alternatives were discussed at length including but not limited to the risks of infection, bleeding, nerve injury, stiffness, blood clots, the need for revision surgery, cardiopulmonary complications, among others, and they were willing to proceed.  OPERATIVE DESCRIPTION:  The patient was brought to the operative room and placed in a supine position.  Spinal anesthesia was administered.  IV antibiotics were given.  The lower extremity was prepped and draped in the usual sterile fashion.  Time out was performed.  The leg was elevated and exsanguinated and the tourniquet was inflated.  Anterior quadriceps tendon splitting approach was performed.  The patella was retracted and osteophytes were removed.  The anterior horn of the medial and lateral meniscus was removed and cruciate ligaments resected.   The distal femur was opened with the drill and the intramedullary distal femoral cutting jig was utilized, set at 5 degrees resecting 10 mm off the distal femur.  Care was taken to protect the collateral ligaments.  The distal femoral sizing jig was applied, taking care to  avoid notching.  Then the 4-in-1 cutting jig was applied and the anterior and posterior femur was cut, along with the chamfer cuts.    Then the extramedullary tibial cutting jig was utilized making the appropriate cut using the anterior tibial crest as a reference building in appropriate posterior slope.  Care was taken during the cut to protect the medial and collateral ligaments.  The proximal tibia was removed along with the posterior horns of the menisci.   The posterior medial femoral osteophytes and posterior lateral femoral osteophytes were removed.    The flexion gap was then measured and was symmetric with the extension gap, measured at 10.  I completed the distal femoral preparation using the appropriate jig to prepare the box.  The patella was then measured, and cut with the saw.    The proximal tibia sized and prepared accordingly with the reamer and the punch, and then all components were trialed with the trial insert.  The knee was found to have excellent balance and full motion.    The above named components were then cemented into place and all excess cement was removed.  The trial polyethylene component was in place during cementation, and then was exchanged for the real polyethylene component.    The knee was easily taken through a range of motion and the patella tracked well and the knee irrigated copiously and the parapatellar and subcutaneous tissue closed with vicryl, and monocryl with steri strips for the skin.  The arthrotomy was closed at 90 of flexion. The wounds were dressed with sterile gauze and the tourniquet released and the patient was awakened and returned to the PACU  in stable and satisfactory condition.  There were no complications.  Total tourniquet time was 79 minutes.

## 2017-11-16 NOTE — Anesthesia Postprocedure Evaluation (Signed)
Anesthesia Post Note  Patient: Vincent Hernandez  Procedure(s) Performed: LEFT TOTAL KNEE ARTHROPLASTY (Left Knee)     Patient location during evaluation: PACU Anesthesia Type: Spinal Level of consciousness: awake and alert Pain management: pain level controlled Vital Signs Assessment: post-procedure vital signs reviewed and stable Respiratory status: spontaneous breathing and respiratory function stable Cardiovascular status: blood pressure returned to baseline and stable Postop Assessment: spinal receding Anesthetic complications: no    Last Vitals:  Vitals:   11/16/17 1330 11/16/17 1344  BP: 108/77 114/80  Pulse: 65 64  Resp: 15 15  Temp: (!) 36.4 C   SpO2: 100% 100%    Last Pain:  Vitals:   11/16/17 1344  TempSrc:   PainSc: 0-No pain                 Ahmari Garton DANIEL

## 2017-11-16 NOTE — Anesthesia Procedure Notes (Signed)
Spinal  Patient location during procedure: OR Start time: 11/16/2017 10:33 AM End time: 11/16/2017 10:42 AM Staffing Anesthesiologist: Duane Boston, MD Performed: anesthesiologist  Preanesthetic Checklist Completed: patient identified, surgical consent, pre-op evaluation, timeout performed, IV checked, risks and benefits discussed and monitors and equipment checked Spinal Block Patient position: sitting Prep: DuraPrep Patient monitoring: cardiac monitor, continuous pulse ox and blood pressure Approach: midline Location: L3-4 Injection technique: single-shot Needle Needle type: Pencan  Needle gauge: 24 G Needle length: 9 cm Additional Notes Functioning IV was confirmed and monitors were applied. Sterile prep and drape, including hand hygiene and sterile gloves were used. The patient was positioned and the spine was prepped. The skin was anesthetized with lidocaine.  Free flow of clear CSF was obtained prior to injecting local anesthetic into the CSF.  The spinal needle aspirated freely following injection.  The needle was carefully withdrawn.  The patient tolerated the procedure well.

## 2017-11-16 NOTE — Transfer of Care (Signed)
Immediate Anesthesia Transfer of Care Note  Patient: Vincent Hernandez  Procedure(s) Performed: LEFT TOTAL KNEE ARTHROPLASTY (Left Knee)  Patient Location: PACU  Anesthesia Type:Spinal  Level of Consciousness: awake, drowsy and patient cooperative  Airway & Oxygen Therapy: Patient Spontanous Breathing and Patient connected to face mask oxygen  Post-op Assessment: Report given to RN and Post -op Vital signs reviewed and stable  Post vital signs: Reviewed and stable  Last Vitals:  Vitals Value Taken Time  BP 122/71 11/16/2017 12:53 PM  Temp    Pulse 55 11/16/2017 12:54 PM  Resp 13 11/16/2017 12:54 PM  SpO2 100 % 11/16/2017 12:54 PM  Vitals shown include unvalidated device data.  Last Pain:  Vitals:   11/16/17 0915  TempSrc:   PainSc: 0-No pain         Complications: No apparent anesthesia complications

## 2017-11-16 NOTE — Evaluation (Signed)
Physical Therapy Evaluation Patient Details Name: Vincent Hernandez MRN: [id number] DOB: [birth year] Today's Date: 11/16/2017   History of Present Illness  Pt is a 52 YO male s/p L TKR on 9/20. PMH includes OA, dyslipidemia, R TKR 04/2017, C7-T1 fusion, RTC repair 2019, bilateral carpal tunnel release, R foot arthroplasty.   Clinical Impression   Pt s/p L TKR. Pt presents with decreased tolerance for activity, decreased L knee ROM, and some difficulty performing mobility tasks. Pt to benefit from acute PT. Pt ambulated hallway distance with min guard assist. Pt needed some assist steadying this session for balance. Will continue to follow acutely and progress mobility as able.     Follow Up Recommendations Follow surgeon's recommendation for DC plan and follow-up therapies;Supervision for mobility/OOB    Equipment Recommendations  None recommended by PT    Recommendations for Other Services       Precautions / Restrictions Precautions Precautions: Fall;Knee Required Braces or Orthoses: Knee Immobilizer - Left Knee Immobilizer - Left: Discontinue once straight leg raise with < 10 degree lag;Discontinue post op day 2 Restrictions Weight Bearing Restrictions: No Other Position/Activity Restrictions: WBAT       Mobility  Bed Mobility Overal bed mobility: Needs Assistance Bed Mobility: Supine to Sit     Supine to sit: Supervision;HOB elevated     General bed mobility comments: supervision for safety, no verbal cuing required.   Transfers Overall transfer level: Needs assistance Equipment used: Rolling walker (2 wheeled) Transfers: Sit to/from Stand Sit to Stand: Min assist         General transfer comment: Min assist for trunk elevation and steadying upon standing. Verbal cuing for hand placement, but pt still placed bilat UEs on RW.   Ambulation/Gait Ambulation/Gait assistance: Min assist;Supervision;Min guard Gait Distance (Feet): 150 Feet Assistive device: Rolling  walker (2 wheeled) Gait Pattern/deviations: Step-to pattern;Decreased stance time - left;Decreased weight shift to left;Trunk flexed Gait velocity: decr   General Gait Details: Pt with LOB, self-recovered, during 1st gait cycle because pt tried to move too quickly. Min assist provided by PT for steadying, pt advised to slow down and safely ambulate. Pt stated he thought his feet might still have lessened sensation due to spinal anesthesia. Pt ambulated 150 ft, with mix of min guard and supervision for safety. Pt with no more LOB during gait.   Stairs            Wheelchair Mobility    Modified Rankin (Stroke Patients Only)       Balance Overall balance assessment: Mild deficits observed, not formally tested                                           Pertinent Vitals/Pain Pain Assessment: 0-10 Pain Score: 0-No pain    Home Living Family/patient expects to be discharged to:: Private residence Living Arrangements: Spouse/significant other Available Help at Discharge: Family;Available PRN/intermittently Type of Home: House Home Access: Stairs to enter Entrance Stairs-Rails: Right Entrance Stairs-Number of Steps: 2 Home Layout: One level Home Equipment: Walker - 2 wheels;Walker - 4 wheels;Cane - single point;Crutches      Prior Function Level of Independence: Independent               Hand Dominance   Dominant Hand: Right    Extremity/Trunk Assessment   Upper Extremity Assessment Upper Extremity Assessment: Overall WFL for tasks assessed  Lower Extremity Assessment Lower Extremity Assessment: Overall WFL for tasks assessed    Cervical / Trunk Assessment Cervical / Trunk Assessment: Normal  Communication   Communication: No difficulties  Cognition Arousal/Alertness: Awake/alert Behavior During Therapy: Impulsive;WFL for tasks assessed/performed Overall Cognitive Status: Within Functional Limits for tasks assessed                                  General Comments: Pt encouraged to slow down and wait for instructions given pt's impulsivity       General Comments      Exercises Total Joint Exercises Ankle Circles/Pumps: AROM;Both;10 reps;Seated Quad Sets: AROM;Left;5 reps;Seated Goniometric ROM: knee AAROM approximately 0-90 degrees    Assessment/Plan    PT Assessment Patient needs continued PT services  PT Problem List Decreased strength;Pain;Decreased range of motion;Decreased activity tolerance;Decreased knowledge of use of DME;Decreased balance;Decreased safety awareness;Decreased mobility       PT Treatment Interventions DME instruction;Therapeutic activities;Gait training;Therapeutic exercise;Patient/family education;Stair training;Balance training;Functional mobility training    PT Goals (Current goals can be found in the Care Plan section)  Acute Rehab PT Goals PT Goal Formulation: With patient Time For Goal Achievement: 11/30/17 Potential to Achieve Goals: Good    Frequency 7X/week   Barriers to discharge        Co-evaluation               AM-PAC PT "6 Clicks" Daily Activity  Outcome Measure Difficulty turning over in bed (including adjusting bedclothes, sheets and blankets)?: Unable Difficulty moving from lying on back to sitting on the side of the bed? : Unable Difficulty sitting down on and standing up from a chair with arms (e.g., wheelchair, bedside commode, etc,.)?: Unable Help needed moving to and from a bed to chair (including a wheelchair)?: None Help needed walking in hospital room?: A Little Help needed climbing 3-5 steps with a railing? : A Little 6 Click Score: 13    End of Session Equipment Utilized During Treatment: Gait belt Activity Tolerance: Patient tolerated treatment well Patient left: in chair;with chair alarm set;with call bell/phone within reach;with family/visitor present;with SCD's reapplied Nurse Communication: Mobility status PT Visit  Diagnosis: Other abnormalities of gait and mobility (R26.89);Difficulty in walking, not elsewhere classified (R26.2)    Time: 2878-6767 PT Time Calculation (min) (ACUTE ONLY): 26 min   Charges:   PT Evaluation $PT Eval Low Complexity: 1 Low PT Treatments $Gait Training: 8-22 mins        Julien Girt, PT Acute Rehabilitation Services Pager [phone]  Office [phone]   Roxine Caddy D Elonda Husky 11/16/2017, 6:43 PM

## 2017-11-16 NOTE — Care Management Note (Signed)
Case Management Note  Patient Details  Name: BENTLEIGH STANKUS MRN: [id number] Date of Birth: [birth year]  Subjective/Objective:      Spoke with patient at bedside. Confirmed plan for OP PT, already arranged. Has DME. [phone]              Action/Plan:   Expected Discharge Date:  11/16/17               Expected Discharge Plan:  OP Rehab  In-House Referral:  NA  Discharge planning Services  CM Consult  Post Acute Care Choice:  NA Choice offered to:  Patient, Spouse  DME Arranged:  N/A DME Agency:  NA  HH Arranged:  NA HH Agency:  NA  Status of Service:  Completed, signed off  If discussed at [place] of Stay Meetings, dates discussed:    Additional Comments:  Guadalupe Maple, RN 11/16/2017, 2:58 PM

## 2017-11-16 NOTE — Anesthesia Preprocedure Evaluation (Addendum)
Anesthesia Evaluation  Patient identified by MRN, date of birth, ID band Patient awake    Reviewed: Allergy & Precautions, NPO status , Patient's Chart, lab work & pertinent test results  History of Anesthesia Complications Negative for: history of anesthetic complications  Airway Mallampati: III  TM Distance: >3 FB Neck ROM: Full    Dental  (+) Teeth Intact, Dental Advisory Given   Pulmonary former smoker,    Pulmonary exam normal        Cardiovascular negative cardio ROS Normal cardiovascular exam Rhythm:Regular     Neuro/Psych  Headaches, negative psych ROS   GI/Hepatic Neg liver ROS, GERD  ,  Endo/Other  Morbid obesity  Renal/GU negative Renal ROS     Musculoskeletal  (+) Arthritis ,   Abdominal (+) + obese,   Peds  Hematology   Anesthesia Other Findings   Reproductive/Obstetrics                             Anesthesia Physical  Anesthesia Plan  ASA: III  Anesthesia Plan: Spinal   Post-op Pain Management:  Regional for Post-op pain   Induction: Intravenous  PONV Risk Score and Plan: 1 and Ondansetron and Propofol infusion  Airway Management Planned: Natural Airway and Simple Face Mask  Additional Equipment:   Intra-op Plan:   Post-operative Plan:   Informed Consent: I have reviewed the patients History and Physical, chart, labs and discussed the procedure including the risks, benefits and alternatives for the proposed anesthesia with the patient or authorized representative who has indicated his/her understanding and acceptance.   Dental advisory given  Plan Discussed with: CRNA and Anesthesiologist  Anesthesia Plan Comments:        Anesthesia Quick Evaluation

## 2017-11-16 NOTE — Progress Notes (Signed)
Assisted Dr. Singer with left, ultrasound guided, adductor canal block. Side rails up, monitors on throughout procedure. See vital signs in flow sheet. Tolerated Procedure well.  

## 2017-11-16 NOTE — Anesthesia Procedure Notes (Signed)
Anesthesia Regional Block: Adductor canal block   Pre-Anesthetic Checklist: ,, timeout performed, Correct Patient, Correct Site, Correct Laterality, Correct Procedure, Correct Position, site marked, Risks and benefits discussed,  Surgical consent,  Pre-op evaluation,  At surgeon's request and post-op pain management  Laterality: Left  Prep: chloraprep       Needles:  Injection technique: Single-shot  Needle Type: Stimulator Needle - 80     Needle Length: 10cm  Needle Gauge: 21     Additional Needles:   Narrative:  Start time: 11/16/2017 9:18 AM End time: 11/16/2017 9:28 AM Injection made incrementally with aspirations every 5 mL.  Performed by: Personally

## 2017-11-16 NOTE — Interval H&P Note (Signed)
History and Physical Interval Note:  11/16/2017 9:55 AM  Vincent Hernandez  has presented today for surgery, with the diagnosis of Left knee osteoarthritis  The various methods of treatment have been discussed with the patient and family. After consideration of risks, benefits and other options for treatment, the patient has consented to  Procedure(s): LEFT TOTAL KNEE ARTHROPLASTY (Left) as a surgical intervention .  The patient's history has been reviewed, patient examined, no change in status, stable for surgery.  I have reviewed the patient's chart and labs.  Questions were answered to the patient's satisfaction.     Ehren Berisha ANDREW

## 2017-11-17 LAB — BASIC METABOLIC PANEL
ANION GAP: 8 (ref 5–15)
BUN: 21 mg/dL — ABNORMAL HIGH (ref 6–20)
CALCIUM: 8.6 mg/dL — AB (ref 8.9–10.3)
CO2: 28 mmol/L (ref 22–32)
Chloride: 105 mmol/L (ref 98–111)
Creatinine, Ser: 0.79 mg/dL (ref 0.61–1.24)
GFR calc Af Amer: >60 mL/min (ref >60)
GLUCOSE: 163 mg/dL — AB (ref 70–99)
Potassium: 4.3 mmol/L (ref 3.5–5.1)
SODIUM: 141 mmol/L (ref 135–145)

## 2017-11-17 LAB — CBC
HCT: 39 % (ref 39.0–52.0)
Hemoglobin: 13.4 g/dL (ref 13.0–17.0)
MCH: 31.9 pg (ref 26.0–34.0)
MCHC: 34.4 g/dL (ref 30.0–36.0)
MCV: 92.9 fL (ref 78.0–100.0)
PLATELETS: 286 10*3/uL (ref 150–400)
RBC: 4.2 MIL/uL — ABNORMAL LOW (ref 4.22–5.81)
RDW: 13 % (ref 11.5–15.5)
WBC: 17.1 10*3/uL — AB (ref 4.0–10.5)

## 2017-11-17 NOTE — Progress Notes (Signed)
Discharged from floor via w/c for transport home by car. Wife & belongings with pt. No changes in assessment. Vincent Hernandez, [employer]

## 2017-11-17 NOTE — Progress Notes (Signed)
   Subjective: 1 Day Post-Op Procedure(s) (LRB): LEFT TOTAL KNEE ARTHROPLASTY (Left)  Pt walking and working on his exercises  Doing very well  Would like to d/c home today C/o minimal pain in the knee Patient reports pain as mild.  Objective:   VITALS:   Vitals:   11/17/17 0117 11/17/17 0520  BP: 126/67 116/65  Pulse: 73 70  Resp: 17 16  Temp: 97.7 F (36.5 C) 97.9 F (36.6 C)  SpO2: 97% 97%    Left knee: dressing intact Drain pulled nv intact distally No rashes or edema  LABS Recent Labs    11/17/17 0412  HGB 13.4  HCT 39.0  WBC 17.1*  PLT 286    Recent Labs    11/17/17 0412  NA 141  K 4.3  BUN 21*  CREATININE 0.79  GLUCOSE 163*     Assessment/Plan: 1 Day Post-Op Procedure(s) (LRB): LEFT TOTAL KNEE ARTHROPLASTY (Left) Pt doing very well with therapy  Plan for d/c later this afternoon F/u in 2 weeks in the office Pt in agreement Pulmonary toilet   [facility] [place] [place] is now [employer] [facility] [address], [place], [place],  [address] Phone: [phone] [email or web address] Facebook  [employer]

## 2017-11-17 NOTE — Discharge Summary (Signed)
Orthopedic Discharge Summary        Physician Discharge Summary  Patient ID: Vincent Hernandez MRN: [id number] DOB/AGE: 1965-12-23 52 y.o.  Admit date: 11/16/2017 Discharge date: 11/17/2017   Procedures:  Procedure(s) (LRB): LEFT TOTAL KNEE ARTHROPLASTY (Left)  Attending Physician:  Dr. Theda Sers  Admission Diagnoses:   Left knee end stage osteoarthritis  Discharge Diagnoses:  Left knee end stage osteoarthritis   Past Medical History:  Diagnosis Date  . Arthritis   . GERD (gastroesophageal reflux disease)    pt. denies  . Headache(784.0)    Migraines  . Herniated disc, cervical    through T1    PCP: Vivi Barrack, MD   Discharged Condition: good  Hospital Course:  Patient underwent the above stated procedure on 11/16/2017. Patient tolerated the procedure well and brought to the recovery room in good condition and subsequently to the floor. Patient had an uncomplicated hospital course and was stable for discharge.   Disposition: Discharge disposition: 01-Home or Self Care      with follow up in 2 weeks     Discharge Instructions    Call MD / Call 911   Complete by:  As directed    If you experience chest pain or shortness of breath, CALL 911 and be transported to the hospital emergency room.  If you develope a fever above 101 F, pus (white drainage) or increased drainage or redness at the wound, or calf pain, call your surgeon's office.   Call MD / Call 911   Complete by:  As directed    If you experience chest pain or shortness of breath, CALL 911 and be transported to the hospital emergency room.  If you develope a fever above 101 F, pus (white drainage) or increased drainage or redness at the wound, or calf pain, call your surgeon's office.   Constipation Prevention   Complete by:  As directed    Drink plenty of fluids.  Prune juice may be helpful.  You may use a stool softener, such as Colace (over the counter) 100 mg twice a day.  Use MiraLax (over the  counter) for constipation as needed.   Constipation Prevention   Complete by:  As directed    Drink plenty of fluids.  Prune juice may be helpful.  You may use a stool softener, such as Colace (over the counter) 100 mg twice a day.  Use MiraLax (over the counter) for constipation as needed.   Diet - low sodium heart healthy   Complete by:  As directed    Diet - low sodium heart healthy   Complete by:  As directed    Discharge instructions   Complete by:  As directed    INSTRUCTIONS AFTER JOINT REPLACEMENT   Remove items at home which could result in a fall. This includes throw rugs or furniture in walking pathways ICE to the affected joint every three hours while awake for 30 minutes at a time, for at least the first 3-5 days, and then as needed for pain and swelling.  Continue to use ice for pain and swelling. You may notice swelling that will progress down to the foot and ankle.  This is normal after surgery.  Elevate your leg when you are not up walking on it.   Continue to use the breathing machine you got in the hospital (incentive spirometer) which will help keep your temperature down.  It is common for your temperature to cycle up and down following surgery, especially  at night when you are not up moving around and exerting yourself.  The breathing machine keeps your lungs expanded and your temperature down.   DIET:  As you were doing prior to hospitalization, we recommend a well-balanced diet.  DRESSING / WOUND CARE / SHOWERING  Keep the surgical dressing until follow up.  The dressing is water proof, so you can shower without any extra covering.  IF THE DRESSING FALLS OFF or the wound gets wet inside, change the dressing with sterile gauze.  Please use good hand washing techniques before changing the dressing.  Do not use any lotions or creams on the incision until instructed by your surgeon.    ACTIVITY  Increase activity slowly as tolerated, but follow the weight bearing  instructions below.   No driving for 6 weeks or until further direction given by your physician.  You cannot drive while taking narcotics.  No lifting or carrying greater than 10 lbs. until further directed by your surgeon. Avoid periods of inactivity such as sitting longer than an hour when not asleep. This helps prevent blood clots.  You may return to work once you are authorized by your doctor.     WEIGHT BEARING   Weight bearing as tolerated with assist device (walker, cane, etc) as directed, use it as long as suggested by your surgeon or therapist, typically at least 4-6 weeks.   EXERCISES  Results after joint replacement surgery are often greatly improved when you follow the exercise, range of motion and muscle strengthening exercises prescribed by your doctor. Safety measures are also important to protect the joint from further injury. Any time any of these exercises cause you to have increased pain or swelling, decrease what you are doing until you are comfortable again and then slowly increase them. If you have problems or questions, call your caregiver or physical therapist for advice.   Rehabilitation is important following a joint replacement. After just a few days of immobilization, the muscles of the leg can become weakened and shrink (atrophy).  These exercises are designed to build up the tone and strength of the thigh and leg muscles and to improve motion. Often times heat used for twenty to thirty minutes before working out will loosen up your tissues and help with improving the range of motion but do not use heat for the first two weeks following surgery (sometimes heat can increase post-operative swelling).   These exercises can be done on a training (exercise) mat, on the floor, on a table or on a bed. Use whatever works the best and is most comfortable for you.    Use music or television while you are exercising so that the exercises are a pleasant break in your day. This  will make your life better with the exercises acting as a break in your routine that you can look forward to.   Perform all exercises about fifteen times, three times per day or as directed.  You should exercise both the operative leg and the other leg as well.   Exercises include:   Quad Sets - Tighten up the muscle on the front of the thigh (Quad) and hold for 5-10 seconds.   Straight Leg Raises - With your knee straight (if you were given a brace, keep it on), lift the leg to 60 degrees, hold for 3 seconds, and slowly lower the leg.  Perform this exercise against resistance later as your leg gets stronger.  Leg Slides: Lying on your back, slowly slide  your foot toward your buttocks, bending your knee up off the floor (only go as far as is comfortable). Then slowly slide your foot back down until your leg is flat on the floor again.  Angel Wings: Lying on your back spread your legs to the side as far apart as you can without causing discomfort.  Hamstring Strength:  Lying on your back, push your heel against the floor with your leg straight by tightening up the muscles of your buttocks.  Repeat, but this time bend your knee to a comfortable angle, and push your heel against the floor.  You may put a pillow under the heel to make it more comfortable if necessary.   A rehabilitation program following joint replacement surgery can speed recovery and prevent re-injury in the future due to weakened muscles. Contact your doctor or a physical therapist for more information on knee rehabilitation.    CONSTIPATION  Constipation is defined medically as fewer than three stools per week and severe constipation as less than one stool per week.  Even if you have a regular bowel pattern at home, your normal regimen is likely to be disrupted due to multiple reasons following surgery.  Combination of anesthesia, postoperative narcotics, change in appetite and fluid intake all can affect your bowels.   YOU MUST use  at least one of the following options; they are listed in order of increasing strength to get the job done.  They are all available over the counter, and you may need to use some, POSSIBLY even all of these options:    Drink plenty of fluids (prune juice may be helpful) and high fiber foods Colace 100 mg by mouth twice a day  Senokot for constipation as directed and as needed Dulcolax (bisacodyl), take with full glass of water  Miralax (polyethylene glycol) once or twice a day as needed.  If you have tried all these things and are unable to have a bowel movement in the first 3-4 days after surgery call either your surgeon or your primary doctor.    If you experience loose stools or diarrhea, hold the medications until you stool forms back up.  If your symptoms do not get better within 1 week or if they get worse, check with your doctor.  If you experience "the worst abdominal pain ever" or develop nausea or vomiting, please contact the office immediately for further recommendations for treatment.   ITCHING:  If you experience itching with your medications, try taking only a single pain pill, or even half a pain pill at a time.  You can also use Benadryl over the counter for itching or also to help with sleep.   TED HOSE STOCKINGS:  Use stockings on both legs until for at least 2 weeks or as directed by physician office. They may be removed at night for sleeping.  MEDICATIONS:  See your medication summary on the "After Visit Summary" that nursing will review with you.  You may have some home medications which will be placed on hold until you complete the course of blood thinner medication.  It is important for you to complete the blood thinner medication as prescribed.  PRECAUTIONS:  If you experience chest pain or shortness of breath - call 911 immediately for transfer to the hospital emergency department.   If you develop a fever greater that 101 F, purulent drainage from wound, increased  redness or drainage from wound, foul odor from the wound/dressing, or calf pain - CONTACT YOUR SURGEON.  FOLLOW-UP APPOINTMENTS:  If you do not already have a post-op appointment, please call the office for an appointment to be seen by your surgeon.  Guidelines for how soon to be seen are listed in your "After Visit Summary", but are typically between 1-4 weeks after surgery.  OTHER INSTRUCTIONS:   Knee Replacement:  Do not place pillow under knee, focus on keeping the knee straight while resting. CPM instructions: 0-90 degrees, 2 hours in the morning, 2 hours in the afternoon, and 2 hours in the evening. Place foam block, curve side up under heel at all times except when in CPM or when walking.  DO NOT modify, tear, cut, or change the foam block in any way.  MAKE SURE YOU:  Understand these instructions.  Get help right away if you are not doing well or get worse.    Thank you for letting us be a part of your medical care team.  It is a privilege we respect greatly.  We hope these instructions will help you stay on track for a fast and full recovery!   Increase activity slowly as tolerated   Complete by:  As directed    Increase activity slowly as tolerated   Complete by:  As directed       Allergies as of 11/17/2017      Reactions   Robaxin [methocarbamol] Itching   Tdap [tetanus-diphth-acell Pertussis] Other (See Comments)   "Locked up" aches and pains      Medication List    STOP taking these medications   aspirin-acetaminophen-caffeine 250-250-65 MG tablet Commonly known as:  EXCEDRIN MIGRAINE   naproxen sodium 220 MG tablet Commonly known as:  ALEVE   SUMAtriptan 25 MG tablet Commonly known as:  IMITREX     TAKE these medications   aspirin EC 325 MG tablet Take 1 tablet (325 mg total) by mouth 2 (two) times daily. What changed:    medication strength  how much to take  when to take this   cyclobenzaprine 10 MG  tablet Commonly known as:  FLEXERIL Take 10 mg by mouth 3 (three) times daily as needed for muscle spasms.   diclofenac sodium 1 % Gel Commonly known as:  VOLTAREN Apply 1 application topically 3 (three) times daily as needed (pain).   Echinacea 400 MG Caps Take 800 mg by mouth daily.   Fish Oil 1000 MG Caps Take 4,000 mg by mouth daily.   HYDROcodone-acetaminophen 5-325 MG tablet Commonly known as:  NORCO/VICODIN Take 1 tablet by mouth every 6 (six) hours as needed for moderate pain.   JOINT HEALTH PO Take 2 tablets by mouth daily.   multivitamin with minerals Tabs tablet Take 1 tablet by mouth daily.   oxyCODONE 5 MG immediate release tablet Commonly known as:  Oxy IR/ROXICODONE Take 1 tablet (5 mg total) by mouth every 4 (four) hours as needed for breakthrough pain.   pyridOXINE 100 MG tablet Commonly known as:  VITAMIN B-6 Take 100 mg by mouth daily.   SUPER B COMPLEX/C Caps Take 1 capsule by mouth daily.   vitamin C 500 MG tablet Commonly known as:  ASCORBIC ACID Take 500 mg by mouth daily.         Signed: Ventura Bruns 11/17/2017, 9:37 AM  [facility] is now [employer] [facility] [address], [place], [place], [place] [address] Phone: [place]

## 2017-11-17 NOTE — Progress Notes (Signed)
Physical Therapy Treatment Patient Details Name: Vincent Hernandez MRN: [id number] DOB: Feb 27, 1966 Today's Date: 11/17/2017    History of Present Illness Pt is a 52 YO male s/p L TKR on 9/20. PMH includes OA, dyslipidemia, R TKR 04/2017, C7-T1 fusion, RTC repair 2019, bilateral carpal tunnel release, R foot arthroplasty.     PT Comments    POD # 1 am session Pt progressing well. Assisted with amb a great distance.  Performed TKR TE's following handout HEP.  Instructed on proper tech, freq as well as use of ICE.   Follow Up Recommendations  Follow surgeon's recommendation for DC plan and follow-up therapies;Supervision for mobility/OOB     Equipment Recommendations  None recommended by PT    Recommendations for Other Services       Precautions / Restrictions Precautions Precautions: Fall;Knee Precaution Comments: Instructed on KI use  Required Braces or Orthoses: Knee Immobilizer - Left Knee Immobilizer - Left: Discontinue once straight leg raise with < 10 degree lag;Discontinue post op day 2 Restrictions Weight Bearing Restrictions: No Other Position/Activity Restrictions: WBAT     Mobility  Bed Mobility Overal bed mobility: Needs Assistance Bed Mobility: Supine to Sit;Sit to Supine     Supine to sit: Supervision;HOB elevated     General bed mobility comments: able with increased time  Transfers Overall transfer level: Needs assistance Equipment used: Rolling walker (2 wheeled) Transfers: Sit to/from Stand Sit to Stand: Supervision         General transfer comment: one VC for safety with turns  Ambulation/Gait Ambulation/Gait assistance: Supervision Gait Distance (Feet): 176 Feet Assistive device: Rolling walker (2 wheeled) Gait Pattern/deviations: Step-to pattern;Decreased stance time - left;Decreased weight shift to left;Trunk flexed     General Gait Details: tolerated well     has had multiple surgeries     tough   well educated    Proofreader Rankin (Stroke Patients Only)       Balance                                            Cognition Arousal/Alertness: Awake/alert Behavior During Therapy: WFL for tasks assessed/performed Overall Cognitive Status: Within Functional Limits for tasks assessed                                        Exercises      General Comments        Pertinent Vitals/Pain Pain Assessment: 0-10 Pain Score: 3  Pain Location: L knee  Pain Descriptors / Indicators: Aching Pain Intervention(s): Monitored during session;Repositioned;Ice applied    Home Living                      Prior Function            PT Goals (current goals can now be found in the care plan section) Progress towards PT goals: Progressing toward goals    Frequency    7X/week      PT Plan Current plan remains appropriate    Co-evaluation              AM-PAC PT "6 Clicks" Daily Activity  Outcome Measure  Difficulty turning  over in bed (including adjusting bedclothes, sheets and blankets)?: A Little Difficulty moving from lying on back to sitting on the side of the bed? : A Little Difficulty sitting down on and standing up from a chair with arms (e.g., wheelchair, bedside commode, etc,.)?: A Little Help needed moving to and from a bed to chair (including a wheelchair)?: A Little Help needed walking in hospital room?: A Little Help needed climbing 3-5 steps with a railing? : A Little 6 Click Score: 18    End of Session Equipment Utilized During Treatment: Gait belt Activity Tolerance: Patient tolerated treatment well Patient left: in bed;with call bell/phone within reach Nurse Communication: (pt ready for D/C) PT Visit Diagnosis: Other abnormalities of gait and mobility (R26.89);Difficulty in walking, not elsewhere classified (R26.2)     Time: 0900-0930 PT Time Calculation (min) (ACUTE ONLY): 30 min  Charges:   $Gait Training: 8-22 mins $Therapeutic Exercise: 8-22 mins                     Rica Koyanagi  PTA Acute  Rehabilitation Services Pager      [phone] Office      [phone]

## 2017-11-17 NOTE — Progress Notes (Signed)
Foley cath removed at 0605. Pt tolerated procedure.

## 2017-11-19 ENCOUNTER — Encounter (HOSPITAL_COMMUNITY): Payer: Self-pay | Admitting: Specialist

## 2017-11-28 ENCOUNTER — Ambulatory Visit: Payer: BLUE CROSS/BLUE SHIELD | Admitting: Physician Assistant

## 2017-11-28 ENCOUNTER — Encounter: Payer: Self-pay | Admitting: Physician Assistant

## 2017-11-28 VITALS — BP 130/86 | HR 94 | Temp 97.9°F | Ht 69.0 in | Wt 236.5 lb

## 2017-11-28 DIAGNOSIS — R21 Rash and other nonspecific skin eruption: Secondary | ICD-10-CM

## 2017-11-28 MED ORDER — DOXYCYCLINE HYCLATE 100 MG PO TABS: 100.0000 mg | ORAL_TABLET | Freq: Two times a day (BID) | ORAL | 0 refills | Status: DC

## 2017-11-28 MED ORDER — HYDROXYZINE HCL 25 MG PO TABS: 25.0000 mg | ORAL_TABLET | Freq: Three times a day (TID) | ORAL | 0 refills | Status: DC | PRN

## 2017-11-28 MED ORDER — METHYLPREDNISOLONE 4 MG PO TBPK: ORAL_TABLET | ORAL | 0 refills | Status: DC

## 2017-11-28 NOTE — Progress Notes (Signed)
Vincent Hernandez is a 52 y.o. male here for a new problem.  I acted as a Education administrator for [employer], PA-C Anselmo Pickler, LPN  History of Present Illness:   Chief Complaint  Patient presents with  . Rash    HPI Patient is s/p L total knee arthroplasty on 11/16/17. Denies issues with healing or prior infection. On Monday, he noticed that he started to having itching and blisters to L axilla, R axilla, R and L elbow, L and R hand. Areas are itchy, non tender. They have not been draining. He has tried [place] anti-itch OTC lotion. He states that his daughter was pulling weeds and gave him a hug and he was concerned that maybe she transferred some poison oak to her. He denies fevers, chills.   Past Medical History:  Diagnosis Date  . Arthritis   . GERD (gastroesophageal reflux disease)    pt. denies  . Headache(784.0)    Migraines  . Herniated disc, cervical    through T1     Social History   Socioeconomic History  . Marital status: Married    Spouse name: Rodena Piety  . Number of children: 3  . Years of education: 21  . Highest education level: Not on file  Occupational History  . Occupation: Physiological scientist    Comment: [employer]  Social Needs  . Financial resource strain: Not on file  . Food insecurity:    Worry: Not on file    Inability: Not on file  . Transportation needs:    Medical: Not on file    Non-medical: Not on file  Tobacco Use  . Smoking status: Former Research scientist (life sciences)  . Smokeless tobacco: Never Used  . 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 Alcohol use: No    Alcohol/week: 0.0 standard drinks    Frequency: Never  . Drug use: No  . Sexual activity: Yes    Partners: Female  Lifestyle  . Physical activity:    Days per week: Not on file    Minutes per session: Not on file  . Stress: Not on file  Relationships  . Social connections:    Talks on phone: Not on file    Gets together: Not on file    Attends  religious service: Not on file    Active member of club or organization: Not on file    Attends meetings of clubs or organizations: Not on file    Relationship status: Not on file  . Intimate partner violence:    Fear of current or ex partner: Not on file    Emotionally abused: Not on file    Physically abused: Not on file    Forced sexual activity: Not on file  Other Topics Concern  . Not on file  Social History Narrative   Lives with his wife and 4 dogs (3 Benny Lennert and a [place]).   Adult children live in [place] and [place].    Past Surgical History:  Procedure Laterality Date  . ANTERIOR CERVICAL DECOMP/DISCECTOMY FUSION N/A 09/01/2015   Procedure: ACDF C7-T1;  Surgeon: Melina Schools, MD;  Location: [facility];  Service: Orthopedics;  Laterality: N/A;  . BICEPS TENODESIS Right 06/19/2017   Dr. Theda Sers  . CARPAL TUNNEL RELEASE Bilateral   . FOOT ARTHROPLASTY Right 2011   screws  . GANGLION CYST EXCISION    . KNEE ARTHROSCOPY Left 1/14  . ORIF TOE FRACTURE Left 11/21/2012   Procedure:  OPEN REDUCTION INTERNAL FIXATION (ORIF) LEFT FOURTH METATARSAL NONUNION FRACTURE;  Surgeon: Wylene Simmer, MD;  Location: [facility];  Service: Orthopedics;  Laterality: Left;  . REPAIR QUADRICEPS / HAMSTRING MUSCLE Right   . ROTATOR CUFF REPAIR Right 06/19/2017   Dr. Theda Sers  . SEPTOPLASTY  1995  . SHOULDER ARTHROSCOPY W/ ROTATOR CUFF REPAIR Left 2013  . TOTAL KNEE ARTHROPLASTY Right 05/04/2017   Procedure: RIGHT TOTAL KNEE ARTHROPLASTY;  Surgeon: Sydnee Cabal, MD;  Location: [facility];  Service: Orthopedics;  Laterality: Right;  Adductor Block  . TOTAL KNEE ARTHROPLASTY Left 11/16/2017   Procedure: LEFT TOTAL KNEE ARTHROPLASTY;  Surgeon: Sydnee Cabal, MD;  Location: [facility];  Service: Orthopedics;  Laterality: Left;    Family History  Problem Relation Age of Onset  . Cancer Father        lung  . Stroke Father 36       1973    Allergies  Allergen Reactions  .  Robaxin [Methocarbamol] Itching  . Tdap [Tetanus-Diphth-Acell Pertussis] Other (See Comments)    "Locked up" aches and pains    Current Medications:   Current Outpatient Medications:  .  aspirin EC 325 MG tablet, Take 1 tablet (325 mg total) by mouth 2 (two) times daily., Disp: 60 tablet, Rfl: 3 .  cyclobenzaprine (FLEXERIL) 10 MG tablet, Take 10 mg by mouth 3 (three) times daily as needed for muscle spasms., Disp: , Rfl:  .  diclofenac sodium (VOLTAREN) 1 % GEL, Apply 1 application topically 3 (three) times daily as needed (pain)., Disp: , Rfl:  .  Echinacea 400 MG CAPS, Take 800 mg by mouth daily., Disp: , Rfl:  .  Glucosamine-MSM-Hyaluronic Acd (JOINT HEALTH PO), Take 2 tablets by mouth daily., Disp: , Rfl:  .  HYDROcodone-acetaminophen (NORCO/VICODIN) 5-325 MG tablet, Take 1 tablet by mouth every 6 (six) hours as needed for moderate pain., Disp: , Rfl:  .  Multiple Vitamin (MULTIVITAMIN WITH MINERALS) TABS tablet, Take 1 tablet by mouth daily., Disp: , Rfl:  .  Omega-3 Fatty Acids (FISH OIL) 1000 MG CAPS, Take 4,000 mg by mouth daily., Disp: , Rfl:  .  oxyCODONE (ROXICODONE) 5 MG immediate release tablet, Take 1 tablet (5 mg total) by mouth every 4 (four) hours as needed for breakthrough pain., Disp: 40 tablet, Rfl: 0 .  pyridOXINE (VITAMIN B-6) 100 MG tablet, Take 100 mg by mouth daily., Disp: , Rfl:  .  SUPER B COMPLEX/C CAPS, Take 1 capsule by mouth daily., Disp: , Rfl:  .  vitamin C (ASCORBIC ACID) 500 MG tablet, Take 500 mg by mouth daily., Disp: , Rfl:  .  doxycycline (VIBRA-TABS) 100 MG tablet, Take 1 tablet (100 mg total) by mouth 2 (two) times daily., Disp: 20 tablet, Rfl: 0 .  hydrOXYzine (ATARAX/VISTARIL) 25 MG tablet, Take 1 tablet (25 mg total) by mouth 3 (three) times daily as needed for itching., Disp: 30 tablet, Rfl: 0 .  methylPREDNISolone (MEDROL DOSEPAK) 4 MG TBPK tablet, 6-5-4-3-2-1-off, Disp: 21 tablet, Rfl: 0   Review of Systems:   Review of Systems  Negative  unless otherwise specified per HPI.  Vitals:   Vitals:   11/28/17 1059  BP: 130/86  Pulse: 94  Temp: 97.9 F (36.6 C)  TempSrc: Oral  SpO2: 95%  Weight: 236 lb 8 oz (107.3 kg)  Height: 5\' 9"  (1.753 m)     Body mass index is 34.92 kg/m.   Physical Exam:   Physical Exam  Constitutional: He appears well-developed.  He is cooperative.  Non-toxic appearance. He does not have a sickly appearance. He does not appear ill. No distress.  Cardiovascular: Normal rate, regular rhythm, S1 normal, S2 normal, normal heart sounds and normal pulses.  No LE edema  Pulmonary/Chest: Effort normal and breath sounds normal.  Neurological: He is alert. GCS eye subscore is 4. GCS verbal subscore is 5. GCS motor subscore is 6.  Skin: Skin is warm, dry and intact.  Multiple erythematous papules on L palm of hand, bilateral elbows, bilateral flanks, lower back and upper buttocks  L knee incision covered with bandage -- did not examine at todays visit  Psychiatric: He has a normal mood and affect. His speech is normal and behavior is normal.  Nursing note and vitals reviewed.                     Assessment and Plan:    Vincent Hernandez was seen today for rash.  Diagnoses and all orders for this visit:  Rash Unclear etiology. Concern for possible bacterial infection? Start oral doxycycline and scheduled atarax. Has a follow-up appointment with orthopedist on Friday, we provided a prescription for medrol dose pack should orthopedist permit it. Reviewed red flags and return to clinic signs, also included this in AVS.  Other orders -     doxycycline (VIBRA-TABS) 100 MG tablet; Take 1 tablet (100 mg total) by mouth 2 (two) times daily. -     hydrOXYzine (ATARAX/VISTARIL) 25 MG tablet; Take 1 tablet (25 mg total) by mouth 3 (three) times daily as needed for itching. -     methylPREDNISolone (MEDROL DOSEPAK) 4 MG TBPK tablet; 6-5-4-3-2-1-off  . Reviewed expectations re: course of current medical  issues. . Discussed self-management of symptoms. . Outlined signs and symptoms indicating need for more acute intervention. . Patient verbalized understanding and all questions were answered. . See orders for this visit as documented in the electronic medical record. . Patient received an After-Visit Summary.  CMA or LPN served as scribe during this visit. History, Physical, and Plan performed by medical provider. The above documentation has been reviewed and is accurate and complete.   Inda Coke, PA-C

## 2017-11-28 NOTE — Patient Instructions (Signed)
It was great to see you!  Start the antibiotic -- oral doxycycline. Start the antihistamine, Atarax -- and take scheduled for itching.   If your orthopedist is okay with it, fill the prescription and start the oral steroid to help calm down your reaction.  Take care,  Inda Coke PA-C   Contact a health care provider if:  You sweat at night.  You lose weight.  You urinate more than normal.  You feel weak.  You vomit.  Your skin or the whites of your eyes look yellow (jaundice).  Your skin: ? Tingles. ? Is numb.  Your rash: ? Does not go away after several days. ? Gets worse.  You are: ? Unusually thirsty. ? More tired than normal.  You have: ? New symptoms. ? Pain in your abdomen. ? A fever. ? Diarrhea. Get help right away if:  You develop a rash that covers all or most of your body. The rash may or may not be painful.  You develop blisters that: ? Are on top of the rash. ? Grow larger or grow together. ? Are painful. ? Are inside your nose or mouth.  You develop a rash that: ? Looks like purple pinprick-sized spots all over your body. ? Has a "bull's eye" or looks like a target. ? Is not related to sun exposure, is red and painful, and causes your skin to peel. This information is not intended to replace advice given to you by your health care provider. Make sure you discuss any questions you have with your health care provider.

## 2017-11-30 ENCOUNTER — Ambulatory Visit: Payer: BLUE CROSS/BLUE SHIELD | Admitting: Family Medicine

## 2017-11-30 DIAGNOSIS — Z471 Aftercare following joint replacement surgery: Secondary | ICD-10-CM | POA: Diagnosis not present

## 2017-11-30 DIAGNOSIS — Z96652 Presence of left artificial knee joint: Secondary | ICD-10-CM | POA: Diagnosis not present

## 2017-12-04 DIAGNOSIS — M25662 Stiffness of left knee, not elsewhere classified: Secondary | ICD-10-CM | POA: Diagnosis not present

## 2017-12-06 DIAGNOSIS — M25662 Stiffness of left knee, not elsewhere classified: Secondary | ICD-10-CM | POA: Diagnosis not present

## 2017-12-11 DIAGNOSIS — M25662 Stiffness of left knee, not elsewhere classified: Secondary | ICD-10-CM | POA: Diagnosis not present

## 2017-12-18 DIAGNOSIS — M25662 Stiffness of left knee, not elsewhere classified: Secondary | ICD-10-CM | POA: Diagnosis not present

## 2017-12-20 DIAGNOSIS — M25662 Stiffness of left knee, not elsewhere classified: Secondary | ICD-10-CM | POA: Diagnosis not present

## 2017-12-25 DIAGNOSIS — M25662 Stiffness of left knee, not elsewhere classified: Secondary | ICD-10-CM | POA: Diagnosis not present

## 2017-12-27 DIAGNOSIS — M25662 Stiffness of left knee, not elsewhere classified: Secondary | ICD-10-CM | POA: Diagnosis not present

## 2018-01-01 DIAGNOSIS — M25662 Stiffness of left knee, not elsewhere classified: Secondary | ICD-10-CM | POA: Diagnosis not present

## 2018-01-03 DIAGNOSIS — M25662 Stiffness of left knee, not elsewhere classified: Secondary | ICD-10-CM | POA: Diagnosis not present

## 2018-03-06 DIAGNOSIS — M25662 Stiffness of left knee, not elsewhere classified: Secondary | ICD-10-CM | POA: Diagnosis not present

## 2018-03-13 DIAGNOSIS — M25662 Stiffness of left knee, not elsewhere classified: Secondary | ICD-10-CM | POA: Diagnosis not present

## 2018-03-21 DIAGNOSIS — M25662 Stiffness of left knee, not elsewhere classified: Secondary | ICD-10-CM | POA: Diagnosis not present

## 2018-07-12 ENCOUNTER — Ambulatory Visit (INDEPENDENT_AMBULATORY_CARE_PROVIDER_SITE_OTHER): Payer: BLUE CROSS/BLUE SHIELD | Admitting: Family Medicine

## 2018-07-12 ENCOUNTER — Encounter: Payer: Self-pay | Admitting: Family Medicine

## 2018-07-12 VITALS — Ht 69.0 in | Wt 230.0 lb

## 2018-07-12 DIAGNOSIS — R739 Hyperglycemia, unspecified: Secondary | ICD-10-CM | POA: Diagnosis not present

## 2018-07-12 DIAGNOSIS — G43909 Migraine, unspecified, not intractable, without status migrainosus: Secondary | ICD-10-CM

## 2018-07-12 DIAGNOSIS — M791 Myalgia, unspecified site: Secondary | ICD-10-CM | POA: Diagnosis not present

## 2018-07-12 DIAGNOSIS — M2550 Pain in unspecified joint: Secondary | ICD-10-CM | POA: Diagnosis not present

## 2018-07-12 DIAGNOSIS — R635 Abnormal weight gain: Secondary | ICD-10-CM | POA: Diagnosis not present

## 2018-07-12 DIAGNOSIS — E1169 Type 2 diabetes mellitus with other specified complication: Secondary | ICD-10-CM | POA: Diagnosis not present

## 2018-07-12 DIAGNOSIS — R5381 Other malaise: Secondary | ICD-10-CM

## 2018-07-12 NOTE — Addendum Note (Signed)
Addended by: Francis Dowse T on: 07/12/2018 02:28 PM   Modules accepted: Orders

## 2018-07-12 NOTE — Progress Notes (Signed)
   Chief Complaint:  Vincent Hernandez is a 53 y.o. male who presents today for a virtual office visit with a chief complaint of headache.   Assessment/Plan:  Headache No redflags. He has a vague constellation of symptoms including myalgias fatigue, and sore throat.  He likely has a viral syndrome however no signs for COVID-19.  Recent labs from a few months ago significant for hyperglycemia and leukocytosis.  Will have patient come into the office to check labs including CBC, C met, TSH, and A1c.  He can continue taking over-the-counter analgesics as needed.  Encouraged good oral hydration.  Discussed reasons to return to care.    Subjective:  HPI:  Headache Patient has history of chronic daily migraines.  Over the last 3 to 4 weeks has noticed progressively worsening headache.  He has had several other associated symptoms including sore throat, myalgias, arthralgias, malaise, and frequent urination.  No cough.  No fevers or chills.  He is waking up 4-5 times per night to urinate.  He has tried taking Excedrin which has not been particularly effective.  No known sick contacts.  No shortness of breath.  No chest pain.  No other obvious alleviating or aggravating factors.   ROS: Per HPI  PMH: He reports that he has quit smoking. He has never used smokeless tobacco. He reports that he does not drink alcohol or use drugs.      Objective/Observations  Physical Exam: Gen: NAD, resting comfortably Pulm: Normal work of breathing Neuro: Grossly normal, moves all extremities Psych: Normal affect and thought content  Virtual Visit via Video   I connected with Vincent Hernandez on 07/12/18 at  9:40 AM EDT by a video enabled telemedicine application and verified that I am speaking with the correct person using two identifiers. I discussed the limitations of evaluation and management by telemedicine and the availability of in person appointments. The patient expressed understanding and agreed to proceed.    Patient location: Home Provider location: [place] participating in the virtual visit: Myself and Patient     Algis Greenhouse. Jerline Pain, MD 07/12/2018 1:54 PM

## 2018-07-12 NOTE — Addendum Note (Signed)
Addended by: Francis Dowse T on: 07/12/2018 02:27 PM   Modules accepted: Orders

## 2018-07-13 LAB — COMPREHENSIVE METABOLIC PANEL
AG Ratio: 1.5 (calc) (ref 1.0–2.5)
ALT: 25 U/L (ref 9–46)
AST: 23 U/L (ref 10–35)
Albumin: 4.2 g/dL (ref 3.6–5.1)
Alkaline phosphatase (APISO): 69 U/L (ref 35–144)
BUN: 19 mg/dL (ref 7–25)
CO2: 27 mmol/L (ref 20–32)
Calcium: 9 mg/dL (ref 8.6–10.3)
Chloride: 104 mmol/L (ref 98–110)
Creat: 0.8 mg/dL (ref 0.70–1.33)
Globulin: 2.8 g/dL (calc) (ref 1.9–3.7)
Glucose, Bld: 90 mg/dL (ref 65–99)
Potassium: 3.9 mmol/L (ref 3.5–5.3)
Sodium: 139 mmol/L (ref 135–146)
Total Bilirubin: 0.7 mg/dL (ref 0.2–1.2)
Total Protein: 7 g/dL (ref 6.1–8.1)

## 2018-07-13 LAB — CBC
HCT: 43.2 % (ref 38.5–50.0)
Hemoglobin: 15.2 g/dL (ref 13.2–17.1)
MCH: 32.3 pg (ref 27.0–33.0)
MCHC: 35.2 g/dL (ref 32.0–36.0)
MCV: 91.7 fL (ref 80.0–100.0)
MPV: 9.9 fL (ref 7.5–12.5)
Platelets: 232 10*3/uL (ref 140–400)
RBC: 4.71 10*6/uL (ref 4.20–5.80)
RDW: 12.6 % (ref 11.0–15.0)
WBC: 5.2 10*3/uL (ref 3.8–10.8)

## 2018-07-13 LAB — HEMOGLOBIN A1C
Hgb A1c MFr Bld: 5 % of total Hgb (ref <5.7)
Mean Plasma Glucose: 97 (calc)
eAG (mmol/L): 5.4 (calc)

## 2018-07-13 LAB — TSH: TSH: 1.25 mIU/L (ref 0.40–4.50)

## 2018-07-17 NOTE — Progress Notes (Signed)
Please inform patient of the following:  Blood work is all NORMAL. No signs of infection or electrolyte abnormalities. Blood sugar is NORMAL. Think that he is having a mild viral syndrome as we discussed and his symptoms should gradually improve over the next week or so. Would like for him to let us know if not.

## 2018-08-08 ENCOUNTER — Ambulatory Visit (INDEPENDENT_AMBULATORY_CARE_PROVIDER_SITE_OTHER): Payer: BC Managed Care – PPO | Admitting: Family Medicine

## 2018-08-08 ENCOUNTER — Encounter: Payer: Self-pay | Admitting: Family Medicine

## 2018-08-08 ENCOUNTER — Telehealth: Payer: Self-pay

## 2018-08-08 VITALS — Temp 99.4°F

## 2018-08-08 DIAGNOSIS — R509 Fever, unspecified: Secondary | ICD-10-CM | POA: Diagnosis not present

## 2018-08-08 DIAGNOSIS — R110 Nausea: Secondary | ICD-10-CM

## 2018-08-08 DIAGNOSIS — J029 Acute pharyngitis, unspecified: Secondary | ICD-10-CM

## 2018-08-08 DIAGNOSIS — Z20822 Contact with and (suspected) exposure to covid-19: Secondary | ICD-10-CM

## 2018-08-08 MED ORDER — PREDNISONE 50 MG PO TABS: ORAL_TABLET | ORAL | 0 refills | Status: DC

## 2018-08-08 MED ORDER — ONDANSETRON 8 MG PO TBDP: 8.0000 mg | ORAL_TABLET | Freq: Three times a day (TID) | ORAL | 0 refills | Status: DC | PRN

## 2018-08-08 NOTE — Telephone Encounter (Signed)
Phone call to pt.  Scheduled for COVID testing on 08/09/18 @ 12:00 PM, at the [facility].  Advised to wear a mask and to remain in car for drive-up test site.  Verb. Understanding.     Loralyn Freshwater, [facility] [place] [facility]  [place] [id number]   MRN: [id number]  01/01/1966   Covid Symptoms

## 2018-08-08 NOTE — Progress Notes (Signed)
    Chief Complaint:  Vincent Hernandez is a 53 y.o. male who presents today for a virtual office visit with a chief complaint of sore throat.   Assessment/Plan:  Sore Throat / Fever / Nausea Concerning for possible COVID-19.  Will send for testing.  It is also possible that he could have bacterial superinfection given that symptoms have been going on for a few weeks.  Cover test is negative, would consider course of empiric antibiotics.  Will start course of prednisone for his sinusitis and sore throat.  Encouraged good oral hydration.  Discussed reasons to return to care and seek emergent care.     Subjective:  HPI:  Sore Throat Started a few weeks ago.  Associated with fever, sinus congestion, nausea, vomiting, blurred vision, body aches, and chills.  Has tried several over-the-counter medications with no improvement.  Had one episode of vomiting this morning.  Has had decreased appetite.  Also with some associated malaise.  No known sick contacts.  No other obvious alleviating or aggravating factors.   ROS: Per HPI  PMH: He reports that he has quit smoking. He has never used smokeless tobacco. He reports that he does not drink alcohol or use drugs.      Objective/Observations  Physical Exam: Gen: NAD, resting comfortably Pulm: Normal work of breathing Neuro: Grossly normal, moves all extremities Psych: Normal affect and thought content  Virtual Visit via Video   I connected with Vincent Hernandez on 08/08/18 at  3:20 PM EDT by a video enabled telemedicine application and verified that I am speaking with the correct person using two identifiers. I discussed the limitations of evaluation and management by telemedicine and the availability of in person appointments. The patient expressed understanding and agreed to proceed.   Patient location: Home Provider location: [place] participating in the virtual visit: Myself and Patient     Algis Greenhouse. Jerline Pain, MD  08/08/2018 3:33 PM

## 2018-08-09 ENCOUNTER — Other Ambulatory Visit: Payer: BC Managed Care – PPO

## 2018-08-09 DIAGNOSIS — R6889 Other general symptoms and signs: Secondary | ICD-10-CM | POA: Diagnosis not present

## 2018-08-09 DIAGNOSIS — Z20822 Contact with and (suspected) exposure to covid-19: Secondary | ICD-10-CM

## 2018-08-11 LAB — NOVEL CORONAVIRUS, NAA: SARS-CoV-2, NAA: NOT DETECTED

## 2018-08-13 ENCOUNTER — Encounter: Payer: Self-pay | Admitting: Family Medicine

## 2018-08-13 ENCOUNTER — Other Ambulatory Visit: Payer: Self-pay

## 2018-08-13 ENCOUNTER — Telehealth: Payer: Self-pay | Admitting: Physical Therapy

## 2018-08-13 ENCOUNTER — Ambulatory Visit (INDEPENDENT_AMBULATORY_CARE_PROVIDER_SITE_OTHER): Payer: BC Managed Care – PPO | Admitting: Family Medicine

## 2018-08-13 DIAGNOSIS — J329 Chronic sinusitis, unspecified: Secondary | ICD-10-CM | POA: Diagnosis not present

## 2018-08-13 DIAGNOSIS — R110 Nausea: Secondary | ICD-10-CM | POA: Diagnosis not present

## 2018-08-13 DIAGNOSIS — G43909 Migraine, unspecified, not intractable, without status migrainosus: Secondary | ICD-10-CM

## 2018-08-13 MED ORDER — AZELASTINE HCL 0.1 % NA SOLN: 2.0000 | Freq: Two times a day (BID) | NASAL | 12 refills | Status: DC

## 2018-08-13 MED ORDER — RIZATRIPTAN BENZOATE 10 MG PO TABS: 10.0000 mg | ORAL_TABLET | ORAL | 3 refills | Status: DC | PRN

## 2018-08-13 MED ORDER — AMOXICILLIN-POT CLAVULANATE 875-125 MG PO TABS: 1.0000 | ORAL_TABLET | Freq: Two times a day (BID) | ORAL | 0 refills | Status: DC

## 2018-08-13 NOTE — Telephone Encounter (Signed)
Rx has been sent to pharmacy

## 2018-08-13 NOTE — Assessment & Plan Note (Signed)
Worsening.  Possibly due to sinus infection as noted above.  Will treat sinus infection.  If continues to have severe migraines, may need referral to neurology.

## 2018-08-13 NOTE — Progress Notes (Signed)
   Chief Complaint:  Vincent Hernandez is a 53 y.o. male who presents today for a virtual office visit with a chief complaint of sinus congestion.   Assessment/Plan:  Sinus Congestion Given symptoms been persistent for several weeks, will start empiric antibiotics.  Start Augmentin twice daily for the next 7 days.  Also start Astelin nasal spray.  Encouraged good oral hydration.  If no improvement may need referral to ENT.  Nausea Unclear etiology.  He has a fair amount of underlying constipation which is likely playing a role.  Symptoms did not improve significantly with Zofran.  Recommended that he start bowel cleanout with MiraLAX.  Encouraged good oral hydration.  If symptoms do not improve with above, will need office visit with labs including CBC, C met, lipase, and H. pylori.  May ultimately need referral to gastroenterology.  Migraine headache Worsening.  Possibly due to sinus infection as noted above.  Will treat sinus infection.  If continues to have severe migraines, may need referral to neurology.    Subjective:  HPI:  Sinus congestion Patient seen about a week ago for constellation of symptoms including sore throat, rhinorrhea, nausea, and worsening headaches.  That time symptoms were thought to be possibly due to COVID-19.  He was sent for testing which was negative.  He was treated with prednisone and Zofran.  Fortunately myalgias have improved however he has had persistent rhinorrhea and sinus congestion.  Nausea He has been having ongoing nausea for the past couple of months.  Symptoms are worse after eating.  No vomiting.  He took 2 doses of Zofran but is not sure if this helped.  He is also had some worsening constipation over the last couple of weeks.  Not had a bowel movement in 2 days.  Has not tried anything for his constipation.  Headaches/migraines As noted above, headaches have been worsening.  He has taken Imitrex which seems to help.  No other specific treatments  tried.  No reported weakness or numbness.  ROS: Per HPI  PMH: He reports that he has quit smoking. He has never used smokeless tobacco. He reports that he does not drink alcohol or use drugs.      Objective/Observations  Physical Exam: Gen: NAD, resting comfortably Pulm: Normal work of breathing Neuro: Grossly normal, moves all extremities Psych: Normal affect and thought content  Virtual Visit via Video   I connected with [name] on 08/13/18 at 11:20 AM EDT by a video enabled telemedicine application and verified that I am speaking with the correct person using two identifiers. I discussed the limitations of evaluation and management by telemedicine and the availability of in person appointments. The patient expressed understanding and agreed to proceed.   Patient location: Home Provider location: [place] participating in the virtual visit: Myself and Patient     Algis Greenhouse. Jerline Pain, MD 08/13/2018 11:58 AM

## 2018-08-13 NOTE — Telephone Encounter (Signed)
Copied from [place] [id number]. Topic: General - Other >> Aug 13, 2018  1:26 PM Yvette Rack wrote: Reason for CRM: Pt stated he had a virtual visit today but he did not get a Rx for the new migraine medication that they discussed. Pt stated he would like to pick up the new migraine medication along with the other prescriptions that were sent to his pharmacy.

## 2018-08-15 ENCOUNTER — Encounter: Payer: Self-pay | Admitting: Family Medicine

## 2018-08-15 ENCOUNTER — Ambulatory Visit (INDEPENDENT_AMBULATORY_CARE_PROVIDER_SITE_OTHER): Payer: BC Managed Care – PPO | Admitting: Family Medicine

## 2018-08-15 VITALS — Temp 97.1°F

## 2018-08-15 DIAGNOSIS — K5900 Constipation, unspecified: Secondary | ICD-10-CM | POA: Diagnosis not present

## 2018-08-15 DIAGNOSIS — M545 Low back pain, unspecified: Secondary | ICD-10-CM

## 2018-08-15 DIAGNOSIS — R1011 Right upper quadrant pain: Secondary | ICD-10-CM | POA: Diagnosis not present

## 2018-08-15 DIAGNOSIS — R110 Nausea: Secondary | ICD-10-CM | POA: Diagnosis not present

## 2018-08-15 DIAGNOSIS — K625 Hemorrhage of anus and rectum: Secondary | ICD-10-CM

## 2018-08-15 NOTE — Progress Notes (Signed)
Chief Complaint:  Vincent Hernandez is a 53 y.o. male who presents today for a virtual office visit with a chief complaint of rectal bleeding.   Assessment/Plan:  Rectal Bleeding Seems to be slowing down.  Given painless characteristic, is likely represents internal hemorrhoid.  Advised patient this cannot be confirmed without direct visual examination.  Had a CT abdomen pelvis couple years ago which did not show any signs of diverticulosis-do not think this represents diverticular bleed.  Offered GI referral however patient declined.  Do not think he needs to be emergently evaluated at this point as he is not having any symptoms of acute blood loss and symptoms are proving.  If symptoms do not resolve next couple days, will need GI referral.  Gust reasons to return to care and seek emergent care.  Nausea/constipation He has been dealing with these for the past several weeks.  We started him on MiraLAX regimen a couple days ago he has not yet had a normal bowel movement.  Think that his constipation could be contributing to the above.  If he still has persistent nausea with right upper quadrant pain over the next 2 days as we treat his constipation, will need to come in for labs including CBC, C met, lipase, and H. pylori.  Does not have regular bowel movement in a few more days, will consider trial of laxatives such as Linzess.  Low Back Pain No red flags.  Likely muscular strain.  He will follow-up with orthopedics next week.    Subjective:  HPI:  Rectal Bleeding  Patient had one episode of bright red blood per rectum yesterday morning.  States that he was at work when he felt the urge to have a bowel movement.  We went to the the restroom and had a bowel movement states that only bright red blood came out.  He has had a few bowel movements since then that have been normal in characteristic with small amount of blood.  Thinks that his bleeding is slowing down.  Does not have any lower  abdominal pain.  No rectal pain.  He has been having some issues with constipation and nausea for the past several weeks which is not improving.  Has been taking MiraLAX for this which has not yet seem to help.  He also reports that yesterday at work he tweaked his low back and has had been dealing with that.  Took a hydrocodone that he had leftover which seemed out with the pain however thinks it massively could have made his constipation worse.  No chest pain or shortness of breath.  No reported dizziness or lightheadedness.  No other treatments tried.  No other obvious alleviating or aggravating factors. Started yesterday morning. Was at work   ROS: Per HPI  PMH: He reports that he has quit smoking. He has never used smokeless tobacco. He reports that he does not drink alcohol or use drugs.      Objective/Observations  Physical Exam: Gen: NAD, resting comfortably Pulm: Normal work of breathing Neuro: Grossly normal, moves all extremities Psych: Normal affect and thought content  Virtual Visit via Video   I connected with Vincent Hernandez on 08/15/18 at 10:20 AM EDT by a video enabled telemedicine application and verified that I am speaking with the correct person using two identifiers. I discussed the limitations of evaluation and management by telemedicine and the availability of in person appointments. The patient expressed understanding and agreed to proceed.   Patient  location: Home Provider location: [place] participating in the virtual visit: Myself and Patient     Algis Greenhouse. Jerline Pain, MD 08/15/2018 9:43 AM

## 2018-08-20 DIAGNOSIS — M545 Low back pain: Secondary | ICD-10-CM | POA: Diagnosis not present

## 2018-09-03 ENCOUNTER — Encounter: Payer: Self-pay | Admitting: Family Medicine

## 2018-09-03 ENCOUNTER — Ambulatory Visit (INDEPENDENT_AMBULATORY_CARE_PROVIDER_SITE_OTHER): Payer: BC Managed Care – PPO | Admitting: Family Medicine

## 2018-09-03 DIAGNOSIS — K5900 Constipation, unspecified: Secondary | ICD-10-CM

## 2018-09-03 DIAGNOSIS — M545 Low back pain, unspecified: Secondary | ICD-10-CM

## 2018-09-03 DIAGNOSIS — R110 Nausea: Secondary | ICD-10-CM

## 2018-09-03 DIAGNOSIS — G43909 Migraine, unspecified, not intractable, without status migrainosus: Secondary | ICD-10-CM

## 2018-09-03 DIAGNOSIS — R1011 Right upper quadrant pain: Secondary | ICD-10-CM | POA: Diagnosis not present

## 2018-09-03 LAB — URINALYSIS, ROUTINE W REFLEX MICROSCOPIC
Bilirubin Urine: NEGATIVE
Hgb urine dipstick: NEGATIVE
Ketones, ur: NEGATIVE
Leukocytes,Ua: NEGATIVE
Nitrite: NEGATIVE
RBC / HPF: NONE SEEN (ref 0–?)
Specific Gravity, Urine: 1.025 (ref 1.000–1.030)
Total Protein, Urine: NEGATIVE
Urine Glucose: NEGATIVE
Urobilinogen, UA: 0.2 (ref 0.0–1.0)
WBC, UA: NONE SEEN (ref 0–?)
pH: 6 (ref 5.0–8.0)

## 2018-09-03 LAB — TSH: TSH: 0.94 u[IU]/mL (ref 0.35–4.50)

## 2018-09-03 LAB — CBC
HCT: 41.8 % (ref 39.0–52.0)
Hemoglobin: 14.7 g/dL (ref 13.0–17.0)
MCHC: 35.1 g/dL (ref 30.0–36.0)
MCV: 93.5 fl (ref 78.0–100.0)
Platelets: 288 10*3/uL (ref 150.0–400.0)
RBC: 4.48 Mil/uL (ref 4.22–5.81)
RDW: 13.4 % (ref 11.5–15.5)
WBC: 6.9 10*3/uL (ref 4.0–10.5)

## 2018-09-03 LAB — COMPREHENSIVE METABOLIC PANEL
ALT: 23 U/L (ref 0–53)
AST: 19 U/L (ref 0–37)
Albumin: 4.2 g/dL (ref 3.5–5.2)
Alkaline Phosphatase: 66 U/L (ref 39–117)
BUN: 16 mg/dL (ref 6–23)
CO2: 30 mEq/L (ref 19–32)
Calcium: 8.9 mg/dL (ref 8.4–10.5)
Chloride: 103 mEq/L (ref 96–112)
Creatinine, Ser: 0.81 mg/dL (ref 0.40–1.50)
GFR: 99.18 mL/min (ref >60.00)
Glucose, Bld: 113 mg/dL — ABNORMAL HIGH (ref 70–99)
Potassium: 3.8 mEq/L (ref 3.5–5.1)
Sodium: 140 mEq/L (ref 135–145)
Total Bilirubin: 0.6 mg/dL (ref 0.2–1.2)
Total Protein: 6.8 g/dL (ref 6.0–8.3)

## 2018-09-03 LAB — LIPASE: Lipase: 33 U/L (ref 11.0–59.0)

## 2018-09-03 LAB — H. PYLORI ANTIBODY, IGG: H Pylori IgG: NEGATIVE

## 2018-09-03 MED ORDER — RIZATRIPTAN BENZOATE 10 MG PO TABS: 10.0000 mg | ORAL_TABLET | ORAL | 3 refills | Status: DC | PRN

## 2018-09-03 NOTE — Progress Notes (Signed)
   Chief Complaint:  Vincent Hernandez is a 53 y.o. male who presents today for a virtual office visit with a chief complaint of abdominal pain/nausea.   Assessment/Plan:  Back pain No red flags.  Likely degenerative disc disease.  It is possible he could have a kidney stone.  Patient will come in to check UA to rule this out.  Right upper quadrant abdominal pain/nausea/constipation Symptoms been going on for several weeks at this point.  Patient will come into the lab to check CBC, C met, lipase, TSH, and H. pylori antibody.  He will continue using Zofran as needed for nausea and laxatives for his constipation.  If above is negative and continues to have symptoms, will need referral to GI.  Migraine disorder Maxalt refilled today.    Subjective:  HPI:  Back pain Started several weeks ago.  Located in bilateral lower back.  He was seen here a few weeks ago diagnosed with a muscle strain and started on prednisone and Flexeril.  Symptoms modestly improved.  He was seen by orthopedics a couple weeks ago and diagnosed with degenerative disc disease.  Symptoms seem be worse with twisting and moving sensation.  Pain worsened and was very severe a few days ago.  Rated as a 8 out of 10 however is now down to a 2-3 out of 10.  Urine has been darker in coloration.  Concerned about possible kidney stone.  Abdominal pain/nausea Symptoms started a few months ago.  He has been seen here a few times for this.  Symptoms have persisted.  He is also had some intermittent issues with constipation.  He was previously on MiraLAX however is now taking a alternative laxative which seems to be working well.  Is having a bowel movement every 2 to 3 days however would like to have a soft bowel movement once daily.  Has a significant amount of bloating and upper abdominal pain after eating usually every meal.  He has had some severe nausea after eating as well.  He has been taking Zofran which seems to help.  No obvious  triggers.  He was having rectal bleeding few weeks ago that is since stopped.  No early satiety.  No fevers or chills.  Migraine disorder Takes Maxalt as needed and tolerates well.  ROS: Per HPI  PMH: He reports that he has quit smoking. He has never used smokeless tobacco. He reports that he does not drink alcohol or use drugs.      Objective/Observations  Physical Exam: Gen: NAD, resting comfortably Pulm: Normal work of breathing Neuro: Grossly normal, moves all extremities Psych: Normal affect and thought content  Virtual Visit via Video   I connected with Vincent Hernandez on 09/03/18 at 11:00 AM EDT by a video enabled telemedicine application and verified that I am speaking with the correct person using two identifiers. I discussed the limitations of evaluation and management by telemedicine and the availability of in person appointments. The patient expressed understanding and agreed to proceed.   Patient location: Home Provider location: [place] [facility] Persons participating in the virtual visit: Myself and Patient     Caleb M. Parker, MD 09/03/2018 11:31 AM  

## 2018-09-10 ENCOUNTER — Encounter: Payer: Self-pay | Admitting: Family Medicine

## 2018-09-10 ENCOUNTER — Telehealth: Payer: Self-pay | Admitting: *Deleted

## 2018-09-10 ENCOUNTER — Other Ambulatory Visit: Payer: Self-pay

## 2018-09-10 DIAGNOSIS — K5900 Constipation, unspecified: Secondary | ICD-10-CM

## 2018-09-10 DIAGNOSIS — R1011 Right upper quadrant pain: Secondary | ICD-10-CM

## 2018-09-10 DIAGNOSIS — R110 Nausea: Secondary | ICD-10-CM

## 2018-09-10 NOTE — Telephone Encounter (Signed)
We are still waiting for the results of patient's urine culture.

## 2018-09-10 NOTE — Telephone Encounter (Signed)
Please see results annotation.  Algis Greenhouse. Jerline Pain, MD 09/10/2018 4:39 PM

## 2018-09-10 NOTE — Telephone Encounter (Signed)
Please advise.  The lab does not have this patient's urine culture.

## 2018-09-10 NOTE — Progress Notes (Signed)
Please inform patient of the following:  His blood work and urine test are all NORMAL. No signs of liver, gallbladder, or pancreas issues. No signs of kidney stones. Recommend referral to GI.  Algis Greenhouse. Jerline Pain, MD 09/10/2018 4:39 PM

## 2018-09-10 NOTE — Telephone Encounter (Signed)
I have reached out to the patient via MyChart. 

## 2018-09-10 NOTE — Telephone Encounter (Signed)
Copied from [place] [id number]. Topic: General - Other >> Sep 10, 2018 11:49 AM Yvette Rack wrote: Reason for CRM: Pt stated he received his lab results and was wondering if someone would be calling him to discuss the results and the next steps. Pt requests call back. Cb# [phone]

## 2018-09-19 DIAGNOSIS — M79642 Pain in left hand: Secondary | ICD-10-CM | POA: Diagnosis not present

## 2018-09-19 DIAGNOSIS — S61412A Laceration without foreign body of left hand, initial encounter: Secondary | ICD-10-CM | POA: Diagnosis not present

## 2018-09-20 DIAGNOSIS — M79642 Pain in left hand: Secondary | ICD-10-CM | POA: Diagnosis not present

## 2018-09-23 ENCOUNTER — Ambulatory Visit: Payer: BC Managed Care – PPO | Admitting: Family Medicine

## 2018-10-04 ENCOUNTER — Ambulatory Visit (INDEPENDENT_AMBULATORY_CARE_PROVIDER_SITE_OTHER): Payer: BC Managed Care – PPO | Admitting: Family Medicine

## 2018-10-04 DIAGNOSIS — S61219S Laceration without foreign body of unspecified finger without damage to nail, sequela: Secondary | ICD-10-CM | POA: Diagnosis not present

## 2018-10-04 DIAGNOSIS — R110 Nausea: Secondary | ICD-10-CM | POA: Diagnosis not present

## 2018-10-04 DIAGNOSIS — R739 Hyperglycemia, unspecified: Secondary | ICD-10-CM | POA: Diagnosis not present

## 2018-10-04 DIAGNOSIS — K5900 Constipation, unspecified: Secondary | ICD-10-CM

## 2018-10-04 MED ORDER — ONDANSETRON 8 MG PO TBDP: 8.0000 mg | ORAL_TABLET | Freq: Three times a day (TID) | ORAL | 1 refills | Status: DC | PRN

## 2018-10-04 NOTE — Progress Notes (Signed)
    Chief Complaint:  Vincent Hernandez is a 53 y.o. male who presents today for a virtual office visit with a chief complaint of nausea.   Assessment/Plan:  Nausea Unclear etiology.  Symptoms seem to be mostly postprandial.  Work-up including CBC, C met, lipase, and H. pylori antibody testing is been negative.  He also has some underlying constipation which is likely playing a role. Recommended that he use miralax as needed to have one soft bowel movement daily. I will refill his zofran.  Possibly could have underlying gastric motility issues.  Referral to GI is pending.  Hyperglycemia Recheck fasting glucose.  Last A1c 5.0 couple months ago.  Finger Laceration Seems to be healing normally. Follow up as needed.     Subjective:  HPI:  Nausea Seen for this about a month ago.  Symptoms have persisted.  Over last couple of weeks he has had to use Zofran frequently for nausea.  Symptoms typically occur after eating.  He has had work-up including CBC, C met, lipase, TSH, and H. pylori antibody testing all of which were negative.  He had rectal bleeding about a month ago that is since stopped.  He has had some issues with constipation and is currently using an over-the-counter laxative which seems to be helping.  Other associated symptoms include bloating and early satiety after eating.  No other obvious alleviating or aggravating factors.  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  Saw orthopedics and was started on antibiotic which seemed to help with the symptoms.  Infection is cleared.  No reported pain.  ROS: Per HPI  PMH: He reports that he has quit smoking. He has never used smokeless tobacco. He reports that he does not drink alcohol or use drugs.      Objective/Observations  Physical Exam: Gen: NAD, resting comfortably Pulm: Normal work of breathing Neuro: Grossly normal, moves all  extremities Psych: Normal affect and thought content  Virtual Visit via Video   I connected with Vincent Hernandez on 10/04/18 at  4:00 PM EDT by a video enabled telemedicine application and verified that I am speaking with the correct person using two identifiers. I discussed the limitations of evaluation and management by telemedicine and the availability of in person appointments. The patient expressed understanding and agreed to proceed.   Patient location: Home Provider location: [place] participating in the virtual visit: Myself and Patient     Vincent Hernandez. Jerline Pain, MD 10/04/2018 11:03 AM

## 2018-10-10 ENCOUNTER — Other Ambulatory Visit (INDEPENDENT_AMBULATORY_CARE_PROVIDER_SITE_OTHER): Payer: BC Managed Care – PPO

## 2018-10-10 ENCOUNTER — Other Ambulatory Visit: Payer: Self-pay

## 2018-10-10 DIAGNOSIS — R739 Hyperglycemia, unspecified: Secondary | ICD-10-CM | POA: Diagnosis not present

## 2018-10-10 LAB — GLUCOSE, RANDOM: Glucose, Bld: 84 mg/dL (ref 70–99)

## 2018-10-10 NOTE — Progress Notes (Signed)
Dr Marigene Ehlers interpretation of your lab work:  Good news! Your blood sugar is NORMAL. You do not have diabetes.    If you have any additional questions, please give [place] a call or send [place] a message through [place].  Take care, Dr Jerline Pain

## 2018-10-11 ENCOUNTER — Encounter: Payer: Self-pay | Admitting: Gastroenterology

## 2018-10-11 ENCOUNTER — Ambulatory Visit: Payer: BC Managed Care – PPO | Admitting: Gastroenterology

## 2018-10-11 VITALS — BP 110/74 | HR 80 | Temp 98.6°F | Ht 68.0 in | Wt 251.5 lb

## 2018-10-11 DIAGNOSIS — R6881 Early satiety: Secondary | ICD-10-CM | POA: Diagnosis not present

## 2018-10-11 DIAGNOSIS — K5900 Constipation, unspecified: Secondary | ICD-10-CM

## 2018-10-11 DIAGNOSIS — R194 Change in bowel habit: Secondary | ICD-10-CM

## 2018-10-11 DIAGNOSIS — Z800 Family history of malignant neoplasm of digestive organs: Secondary | ICD-10-CM

## 2018-10-11 DIAGNOSIS — K219 Gastro-esophageal reflux disease without esophagitis: Secondary | ICD-10-CM | POA: Diagnosis not present

## 2018-10-11 MED ORDER — PEG 3350-KCL-NA BICARB-NACL 420 G PO SOLR: 4000.0000 mL | Freq: Once | ORAL | 0 refills | Status: AC

## 2018-10-11 MED ORDER — OMEPRAZOLE 40 MG PO CPDR: 40.0000 mg | DELAYED_RELEASE_CAPSULE | Freq: Every day | ORAL | 3 refills | Status: DC

## 2018-10-11 NOTE — Progress Notes (Signed)
[facility] VISIT   Primary Care Provider Vivi Barrack, [place] [id number] [phone]  Referring Provider Vivi Barrack, [place],   [address] [phone]  Patient Profile: Vincent Hernandez is a 53 y.o. male with a pmh significant for chronic back pain (secondary to herniated disks), arthritis, reported previous hemorrhoids.  The patient presents to the [facility] for an evaluation and management of problem(s) noted below:  Problem List 1. Early satiety   2. Change in bowel habit   3. Constipation, unspecified constipation type   4. Gastroesophageal reflux disease, esophagitis presence not specified   5. Family hx of colon cancer     History of Present Illness This is the patient's first visit to the outpatient of our GI clinic.  The patient relays that starting in May he began to experience issues of early satiety and fullness that previously had never occurred.  He has had issues with increased eructation and burping.  He has pyrosis periodically especially if he eats spaghetti or tomato based products or spicy foods.  He does not use any acid therapy.  He denies any dysphagia.  Weight has been stable overall (up and down per his report but not significantly changed).  Over the course of that same time.  The patient has been experiencing new constipation.  Previously he would go on a daily basis and now he may not use the restroom for up to 3 days.  He is almost gone to the hospital 2 times because of the significant bloating and pain that he has.  Years ago he had hemorrhoidal bleeding but has not had any since.  Over the course of the last few months he is obviously had issues from the COVID-19 pandemic perspective in regards to his work situation/employment.  He also just recently was told by his wife that she wants a divorce.  Overall stress has been very high.  He wonders if some symptoms  could be a result of that.  Although his father had metastatic lung cancer there was a chance that he may have also had colon cancer (though the patient is not 100% clear).  Patient denies any hematochezia or melena.  He states he was just recently checked for the possibility of diabetes but his random fasting glucose was under 100 when last checked.  GI Review of Systems Positive as above Negative for odynophagia, globus  Review of Systems General: Denies fevers/chills/weight loss HEENT: Denies oral lesions Cardiovascular: Denies chest pain/palpitations Pulmonary: Denies shortness of breath/nocturnal cough Gastroenterological: See HPI Genitourinary: Denies darkened urine or hematuria Hematological: Denies easy bruising/bleeding Endocrine: Denies temperature intolerance Dermatological: Denies jaundice Psychological: Mood is stable   Medications Current Outpatient Medications  Medication Sig Dispense Refill   aspirin EC 325 MG tablet Take 1 tablet (325 mg total) by mouth 2 (two) times daily. 60 tablet 3   azelastine (ASTELIN) 0.1 % nasal spray Place 2 sprays into both nostrils 2 (two) times daily. Use in each nostril as directed 30 mL 12   cyclobenzaprine (FLEXERIL) 10 MG tablet Take 10 mg by mouth 3 (three) times daily as needed for muscle spasms.     Echinacea 400 MG CAPS Take 800 mg by mouth daily.     Glucosamine-MSM-Hyaluronic Acd (JOINT HEALTH PO) Take 2 tablets by mouth daily.     hydrOXYzine (ATARAX/VISTARIL) 25 MG tablet Take 1 tablet (25 mg total) by mouth 3 (three) times daily as needed for  itching. 30 tablet 0   Multiple Vitamin (MULTIVITAMIN WITH MINERALS) TABS tablet Take 1 tablet by mouth daily.     Omega-3 Fatty Acids (FISH OIL) 1000 MG CAPS Take 4,000 mg by mouth daily.     ondansetron (ZOFRAN ODT) 8 MG disintegrating tablet Take 1 tablet (8 mg total) by mouth every 8 (eight) hours as needed for nausea or vomiting. 30 tablet 1   pyridOXINE (VITAMIN B-6) 100 MG  tablet Take 100 mg by mouth daily.     rizatriptan (MAXALT) 10 MG tablet Take 1 tablet (10 mg total) by mouth as needed for migraine. May repeat in 2 hours if needed 30 tablet 3   SUPER B COMPLEX/C CAPS Take 1 capsule by mouth daily.     vitamin C (ASCORBIC ACID) 500 MG tablet Take 500 mg by mouth daily.     omeprazole (PRILOSEC) 40 MG capsule Take 1 capsule (40 mg total) by mouth daily. 90 capsule 3   No current facility-administered medications for this visit.     Allergies Allergies  Allergen Reactions   Robaxin [Methocarbamol] Itching   Tdap [Tetanus-Diphth-Acell Pertussis] Other (See Comments)    "Locked up" aches and pains    Histories Past Medical History:  Diagnosis Date   Arthritis    GERD (gastroesophageal reflux disease)    pt. denies   Herniated disc, cervical    through T1   Migraines    Past Surgical History:  Procedure Laterality Date   ANTERIOR CERVICAL DECOMP/DISCECTOMY FUSION N/A 09/01/2015   Procedure: ACDF C7-T1;  Surgeon: Melina Schools, MD;  Location: [facility];  Service: Orthopedics;  Laterality: N/A;   BICEPS TENODESIS Right 06/19/2017   Dr. Theda Sers   CARPAL TUNNEL RELEASE Bilateral    left 2016, right 2016   FOOT ARTHROPLASTY Right 2011   screws   GANGLION CYST EXCISION     MENISCUS REPAIR Left 2016   ORIF TOE FRACTURE Left 11/21/2012   Procedure: OPEN REDUCTION INTERNAL FIXATION (ORIF) LEFT FOURTH METATARSAL NONUNION FRACTURE;  Surgeon: Wylene Simmer, MD;  Location: [facility];  Service: Orthopedics;  Laterality: Left;   REPAIR QUADRICEPS / HAMSTRING MUSCLE Right 2014   ROTATOR CUFF REPAIR Right 06/19/2017   Dr. Theda Sers   SEPTOPLASTY  1995   x 3 nose surgeries   SHOULDER ARTHROSCOPY W/ ROTATOR CUFF REPAIR Left 2013   TOTAL KNEE ARTHROPLASTY Right 05/04/2017   Procedure: RIGHT TOTAL KNEE ARTHROPLASTY;  Surgeon: Sydnee Cabal, MD;  Location: [facility];  Service: Orthopedics;  Laterality: Right;  Adductor Block   TOTAL  KNEE ARTHROPLASTY Left 11/16/2017   Procedure: LEFT TOTAL KNEE ARTHROPLASTY;  Surgeon: Sydnee Cabal, MD;  Location: [facility];  Service: Orthopedics;  Laterality: Left;   Social History   Socioeconomic History   Marital status: Married    Spouse name: Rodena Piety   Number of children: 3   Years of education: 13   Highest education level: Not on file  Occupational History   Occupation: Physiological scientist    Comment: [employer]  Social Needs   Financial resource strain: Not on file   Food insecurity    Worry: Not on file    Inability: Not on file   Transportation needs    Medical: Not on file    Non-medical: Not on file  Tobacco Use   Smoking status: Former Smoker    Types: Cigarettes   Smokeless tobacco: Never Used   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 Sexual Activity  Alcohol use: Yes    Alcohol/week: 0.0 standard drinks    Frequency: Never    Comment: seldom   Drug use: No   Sexual activity: Yes    Partners: Female  Lifestyle   Physical activity    Days per week: Not on file    Minutes per session: Not on file   Stress: Not on file  Relationships   Social connections    Talks on phone: Not on file    Gets together: Not on file    Attends religious service: Not on file    Active member of club or organization: Not on file    Attends meetings of clubs or organizations: Not on file    Relationship status: Not on file   Intimate partner violence    Fear of current or ex partner: Not on file    Emotionally abused: Not on file    Physically abused: Not on file    Forced sexual activity: Not on file  Other Topics Concern   Not on file  Social History Narrative   Lives with his wife and 4 dogs ([place]ien[place] and a [place]).   Adult children live in [place] and [place].   Family History  Problem Relation Age of Onset   Stroke Father 21       86   Lung cancer Father    Colon cancer Father     Hypertension Mother    Kidney cancer Mother        dialysis   Diabetes Paternal Grandmother    Diabetes Paternal Aunt    Diabetes Paternal Uncle    Esophageal cancer Neg Hx    Inflammatory bowel disease Neg Hx    Liver disease Neg Hx    Pancreatic cancer Neg Hx    Stomach cancer Neg Hx    I have reviewed his medical, social, and family history in detail and updated the electronic medical record as necessary.    PHYSICAL EXAMINATION  BP 110/74 (BP Location: Left Arm, Patient Position: Sitting, Cuff Size: Normal)    Pulse 80    Temp 98.6 F (37 C)    Ht 5\' 8"  (1.727 m) Comment: height measured without shoes   Wt 251 lb 8 oz (114.1 kg)    BMI 38.24 kg/m  Wt Readings from Last 3 Encounters:  10/11/18 251 lb 8 oz (114.1 kg)  07/12/18 230 lb (104.3 kg)  11/28/17 236 lb 8 oz (107.3 kg)  GEN: NAD, appears stated age, doesn't appear chronically ill PSYCH: Cooperative, without pressured speech EYE: Conjunctivae pink, sclerae anicteric ENT: MMM, without oral ulcers, no erythema or exudates noted NECK: Supple CV: RR without R/Gs  RESP: CTAB posteriorly, without wheezing GI: NABS, soft, mild tenderness to deep palpation in the midepigastrium, obese, without rebound or guarding, no HSM appreciated GU: DRE shows evidence of internal hemorrhoids (grade 2 prolapsed), normal rectal tone, brown stool in the vault, possibly enlarged prostate without nodularity felt in the region (patient unable to perform complete DRE due to discomfort however) MSK/EXT: No lower extremity edema SKIN: No jaundice NEURO:  Alert & Oriented x 3, no focal deficits   REVIEW OF DATA  I reviewed the following data at the time of this encounter:  GI Procedures and Studies  No relevant studies to review  Laboratory Studies  Reviewed those labs in epic  Imaging Studies  No relevant imaging studies to review   ASSESSMENT  Mr. Tober is a 53 y.o. male with  a pmh significant for chronic back pain  (secondary to herniated disks), arthritis, reported previous hemorrhoids.  The patient is seen today for evaluation and management of:  1. Early satiety   2. Change in bowel habit   3. Constipation, unspecified constipation type   4. Gastroesophageal reflux disease, esophagitis presence not specified   5. Family hx of colon cancer    The patient is hemodynamically stable.  However, the patient has multiple manifestations that would require [place] to proceed with an endoscopic evaluation from above and below further clarify his issues.  There is no doubt that social stressors may be playing some role with some of his symptoms as result of the employment issues that are occurring as well as his potential upcoming divorce.  We will try to optimize his bowel habits in anticipation of trying to get his procedure completed as well as possible.  We will initiate the patient on MiraLAX as well as fiber supplementation and see if that will be helpful.  A diagnostic upper endoscopy to evaluate for signs of poor etiology for early satiety is necessary as well.  If the above work-up is unremarkable then cross-sectional imaging of this chest and abdomen and pelvis will need to be performed.  Could consider the role of Gastrografin study (he does not have underlying diabetes that we know).  The risks and benefits of endoscopic evaluation were discussed with the patient; these include but are not limited to the risk of perforation, infection, bleeding, missed lesions, lack of diagnosis, severe illness requiring hospitalization, as well as anesthesia and sedation related illnesses.  The patient is agreeable to proceed.  All patient questions were answered, to the best of my ability, and the patient agrees to the aforementioned plan of action with follow-up as indicated.   PLAN  Proceed with scheduling abdominal ultrasound Diagnostic upper/colonoscopy to be performed Continue taking Hardin Negus' stool softener 1-2 times  daily Initiate MiraLAX once to twice daily If no bowel movement after 48 hours then take Dulcolax 10 mg up to 2 times daily 64 to 80 ounces of fluid per day at a minimum Consider possible CT chest/abdomen/pelvis if issues persist and no findings on upper and lower endoscopy Initiate omeprazole 40 mg once daily   Orders Placed This Encounter  Procedures   Ambulatory referral to Gastroenterology    New Prescriptions   OMEPRAZOLE (PRILOSEC) 40 MG CAPSULE    Take 1 capsule (40 mg total) by mouth daily.   Modified Medications   No medications on file    Planned Follow Up No follow-ups on file.   Justice Britain, MD [facility] Office # [phone]

## 2018-10-11 NOTE — Patient Instructions (Addendum)
   If you are age 54 or younger, your body mass index should be between 19-25. Your Body mass index is 38.24 kg/m. If this is out of the aformentioned range listed, please consider follow up with your Primary Care Provider.    You have been scheduled for an endoscopy and colonoscopy. Please follow the written instructions given to you at your visit today.  Please pick up your prep supplies at the pharmacy within the next 1-3 days. If you use inhalers (even only as needed), please bring them with you on the day of your procedure.  Your physician has requested that you go to We have sent the following medications to your pharmacy for you to pick up at your convenience: Omeprazole   [facility].  Start Miralax 1-2 times daily, if no bowel movement after 48 hrs then start Dulcolax 10mg .   Thank you for choosing me and [facility].  Dr. Rush Landmark

## 2018-10-13 ENCOUNTER — Encounter: Payer: Self-pay | Admitting: Gastroenterology

## 2018-10-13 DIAGNOSIS — K5900 Constipation, unspecified: Secondary | ICD-10-CM | POA: Insufficient documentation

## 2018-10-13 DIAGNOSIS — R194 Change in bowel habit: Secondary | ICD-10-CM | POA: Insufficient documentation

## 2018-10-14 ENCOUNTER — Telehealth: Payer: Self-pay

## 2018-10-14 DIAGNOSIS — R6881 Early satiety: Secondary | ICD-10-CM

## 2018-10-14 DIAGNOSIS — K5900 Constipation, unspecified: Secondary | ICD-10-CM

## 2018-10-14 DIAGNOSIS — K219 Gastro-esophageal reflux disease without esophagitis: Secondary | ICD-10-CM

## 2018-10-14 DIAGNOSIS — Z800 Family history of malignant neoplasm of digestive organs: Secondary | ICD-10-CM

## 2018-10-14 DIAGNOSIS — R194 Change in bowel habit: Secondary | ICD-10-CM

## 2018-10-14 NOTE — Telephone Encounter (Signed)
Called and left message for pt, asking for return call.   You have been scheduled for an abdominal ultrasound at [facility] (1st floor of hospital) on 10/17/18 at 10:00 am. Please arrive 15 minutes prior to your appointment for registration. Make certain not to have anything to eat or drink 6 hours prior to your appointment. Should you need to reschedule your appointment, please contact radiology at [phone]. This test typically takes about 30 minutes to perform.

## 2018-10-14 NOTE — Telephone Encounter (Signed)
-----   Message from Irving Copas., MD sent at 10/13/2018 11:12 AM EDT ----- Regarding: Follow-up Vincent Hernandez,As I have thought about the patient a little bit more, I would like to obtain an abdominal ultrasound while we are also getting him ready for his procedures.Thank you.GM

## 2018-10-16 NOTE — Telephone Encounter (Signed)
Pt confirmed abd ultrasound appt for tomorrow.

## 2018-10-17 ENCOUNTER — Ambulatory Visit (HOSPITAL_COMMUNITY)
Admission: RE | Admit: 2018-10-17 | Discharge: 2018-10-17 | Disposition: A | Discharge status: Home / Self Care | Payer: BC Managed Care – PPO | Source: Ambulatory Visit | Attending: Gastroenterology | Admitting: Gastroenterology

## 2018-10-17 ENCOUNTER — Other Ambulatory Visit: Payer: Self-pay

## 2018-10-17 DIAGNOSIS — Z800 Family history of malignant neoplasm of digestive organs: Secondary | ICD-10-CM | POA: Insufficient documentation

## 2018-10-17 DIAGNOSIS — R194 Change in bowel habit: Secondary | ICD-10-CM | POA: Diagnosis not present

## 2018-10-17 DIAGNOSIS — R6881 Early satiety: Secondary | ICD-10-CM | POA: Diagnosis not present

## 2018-10-17 DIAGNOSIS — K5900 Constipation, unspecified: Secondary | ICD-10-CM | POA: Diagnosis not present

## 2018-10-17 DIAGNOSIS — K219 Gastro-esophageal reflux disease without esophagitis: Secondary | ICD-10-CM | POA: Diagnosis not present

## 2018-10-28 ENCOUNTER — Encounter: Payer: Self-pay | Admitting: Gastroenterology

## 2018-10-31 ENCOUNTER — Telehealth: Payer: Self-pay

## 2018-10-31 ENCOUNTER — Telehealth: Payer: Self-pay | Admitting: *Deleted

## 2018-10-31 NOTE — Telephone Encounter (Signed)
Message left along with number to call back if he has had fever,resp s/s,been in close contact with someone with covid 19 or he has been tested positive for covid 19.

## 2018-10-31 NOTE — Telephone Encounter (Signed)
Covid-19 screening questions   Do you now or have you had a fever in the last 14 days?  Do you have any respiratory symptoms of shortness of breath or cough now or in the last 14 days?  Do you have any family members or close contacts with diagnosed or suspected Covid-19 in the past 14 days?  Have you been tested for Covid-19 and found to be positive?       

## 2018-10-31 NOTE — Telephone Encounter (Signed)
Pt answered "NO" to all Covid Questions °

## 2018-11-01 ENCOUNTER — Other Ambulatory Visit: Payer: Self-pay

## 2018-11-01 ENCOUNTER — Ambulatory Visit (AMBULATORY_SURGERY_CENTER): Payer: BC Managed Care – PPO | Admitting: Gastroenterology

## 2018-11-01 ENCOUNTER — Encounter: Payer: Self-pay | Admitting: Gastroenterology

## 2018-11-01 VITALS — BP 125/79 | HR 66 | Temp 98.5°F | Resp 19 | Ht 68.0 in | Wt 251.0 lb

## 2018-11-01 DIAGNOSIS — K2980 Duodenitis without bleeding: Secondary | ICD-10-CM

## 2018-11-01 DIAGNOSIS — K219 Gastro-esophageal reflux disease without esophagitis: Secondary | ICD-10-CM | POA: Diagnosis not present

## 2018-11-01 DIAGNOSIS — K5900 Constipation, unspecified: Secondary | ICD-10-CM

## 2018-11-01 DIAGNOSIS — K3189 Other diseases of stomach and duodenum: Secondary | ICD-10-CM | POA: Diagnosis not present

## 2018-11-01 DIAGNOSIS — R6881 Early satiety: Secondary | ICD-10-CM

## 2018-11-01 DIAGNOSIS — K641 Second degree hemorrhoids: Secondary | ICD-10-CM

## 2018-11-01 DIAGNOSIS — D125 Benign neoplasm of sigmoid colon: Secondary | ICD-10-CM | POA: Diagnosis not present

## 2018-11-01 DIAGNOSIS — D123 Benign neoplasm of transverse colon: Secondary | ICD-10-CM

## 2018-11-01 DIAGNOSIS — R194 Change in bowel habit: Secondary | ICD-10-CM | POA: Diagnosis not present

## 2018-11-01 DIAGNOSIS — Z1211 Encounter for screening for malignant neoplasm of colon: Secondary | ICD-10-CM | POA: Diagnosis not present

## 2018-11-01 DIAGNOSIS — K228 Other specified diseases of esophagus: Secondary | ICD-10-CM | POA: Diagnosis not present

## 2018-11-01 MED ORDER — SODIUM CHLORIDE 0.9 % IV SOLN: 500.0000 mL | Freq: Once | INTRAVENOUS | Status: DC

## 2018-11-01 NOTE — Op Note (Addendum)
[facility] Patient Name: Vincent Hernandez Procedure Date: 11/01/2018 2:40 PM MRN: [id number] Endoscopist: Justice Britain , MD Age: 53 Referring MD:  Date of Birth: [birth year] Gender: Male Account #: [id number] Procedure:                Colonoscopy Indications:              Screening patient at increased risk: Family history                            of possible 1st-degree relative with colorectal                            cancer at age 87 years (or older), This is the                            patient's first colonoscopy, Incidental - Change in                            bowel habits, Incidental - Constipation Medicines:                Monitored Anesthesia Care Procedure:                Pre-Anesthesia Assessment:                           - Prior to the procedure, a History and Physical                            was performed, and patient medications and                            allergies were reviewed. The patient's tolerance of                            previous anesthesia was also reviewed. The risks                            and benefits of the procedure and the sedation                            options and risks were discussed with the patient.                            All questions were answered, and informed consent                            was obtained. Prior Anticoagulants: The patient has                            taken no previous anticoagulant or antiplatelet                            agents. ASA Grade Assessment: II - A patient with  mild systemic disease. After reviewing the risks                            and benefits, the patient was deemed in                            satisfactory condition to undergo the procedure.                           After obtaining informed consent, the colonoscope                            was passed under direct vision. Throughout the                            procedure, the patient's  blood pressure, pulse, and                            oxygen saturations were monitored continuously. The                            Colonoscope was introduced through the anus and                            advanced to the 5 cm into the ileum. The                            colonoscopy was performed without difficulty. The                            patient tolerated the procedure. The quality of the                            bowel preparation was good. The terminal ileum,                            ileocecal valve, appendiceal orifice, and rectum                            were photographed. Scope In: 3:04:06 PM Scope Out: 3:20:38 PM Scope Withdrawal Time: 0 hours 13 minutes 28 seconds  Total Procedure Duration: 0 hours 16 minutes 32 seconds  Findings:                 The digital rectal exam findings include                            hemorrhoids. Pertinent negatives include no                            palpable rectal lesions.                           The terminal ileum and ileocecal valve appeared  normal.                           Two sessile polyps were found in the hepatic                            flexure. The polyps were 2 to 3 mm in size. These                            polyps were removed with a cold snare. Resection                            and retrieval were complete.                           Normal mucosa was found in the entire colon                            otherwise. This was biopsied with a cold forceps                            for histology from the rectum to rule out proctitis.                           Non-bleeding non-thrombosed internal hemorrhoids                            were found during retroflexion, during perianal                            exam and during digital exam. The hemorrhoids were                            Grade II (internal hemorrhoids that prolapse but                            reduce  spontaneously). Complications:            No immediate complications. Estimated Blood Loss:     Estimated blood loss was minimal. Impression:               - Hemorrhoids found on digital rectal exam.                           - The examined portion of the ileum was normal.                           - Two 2 to 3 mm polyps at the hepatic flexure,                            removed with a cold snare. Resected and retrieved.                           - Normal mucosa in the entire examined colon  otherwise. Rectal biopsies to rule out proctitis                            performed.                           - Non-bleeding non-thrombosed internal hemorrhoids. Recommendation:           - The patient will be observed post-procedure,                            until all discharge criteria are met.                           - Discharge patient to home.                           - Patient has a contact number available for                            emergencies. The signs and symptoms of potential                            delayed complications were discussed with the                            patient. Return to normal activities tomorrow.                            Written discharge instructions were provided to the                            patient.                           - High fiber diet.                           - Continue present medications.                           - Await pathology results.                           - Repeat colonoscopy in 7/10 years for surveillance                            based on pathology results and adenomatous tissue .                           - Return to GI clinic as previously scheduled.                           - The findings and recommendations were discussed                            with the patient.  Justice Britain, MD 11/01/2018 3:30:44 PM

## 2018-11-01 NOTE — Progress Notes (Signed)
History reviewed today 

## 2018-11-01 NOTE — Op Note (Signed)
[facility] Patient Name: Vincent Hernandez Procedure Date: 11/01/2018 2:40 PM MRN: [id number] Endoscopist: Justice Britain , MD Age: 53 Referring MD:  Date of Birth: [birth year] Gender: Male Account #: [id number] Procedure:                Upper GI endoscopy Indications:              Heartburn, Gastro-esophageal reflux disease, Early                            satiety Medicines:                Monitored Anesthesia Care Procedure:                Pre-Anesthesia Assessment:                           - Prior to the procedure, a History and Physical                            was performed, and patient medications and                            allergies were reviewed. The patient's tolerance of                            previous anesthesia was also reviewed. The risks                            and benefits of the procedure and the sedation                            options and risks were discussed with the patient.                            All questions were answered, and informed consent                            was obtained. Prior Anticoagulants: The patient has                            taken no previous anticoagulant or antiplatelet                            agents. ASA Grade Assessment: II - A patient with                            mild systemic disease. After reviewing the risks                            and benefits, the patient was deemed in                            satisfactory condition to undergo the procedure.  After obtaining informed consent, the endoscope was                            passed under direct vision. Throughout the                            procedure, the patient's blood pressure, pulse, and                            oxygen saturations were monitored continuously. The                            Endoscope was introduced through the mouth, and                            advanced to the second part of duodenum. The  upper                            GI endoscopy was accomplished without difficulty.                            The patient tolerated the procedure. Scope In: Scope Out: Findings:                 No gross lesions were noted in the entire                            esophagus. Biopsies were taken with a cold forceps                            for histology to rule out EoE and LoE.                           The Z-line was irregular and was found 39 cm from                            the incisors.                           Patchy mildly erythematous mucosa was found on the                            greater curvature of the gastric body and in the                            gastric antrum.                           No other gross lesions were noted in the entire                            examined stomach. Biopsies were taken with a cold  forceps for histology and Helicobacter pylori                            testing.                           No gross lesions were noted in the duodenal bulb,                            in the first portion of the duodenum and in the                            second portion of the duodenum. Biopsies were taken                            with a cold forceps for histology to rule out                            enteropathy. Complications:            No immediate complications. Estimated Blood Loss:     Estimated blood loss was minimal. Impression:               - No gross lesions in esophagus. Biopsied. Z-line                            irregular, 39 cm from the incisors.                           - Erythematous mucosa in the greater curvature of                            the gastric body and antrum. Biopsied.                           - No gross lesions in the duodenal bulb, in the                            first portion of the duodenum and in the second                            portion of the duodenum. Biopsied. Recommendation:            - Proceed to scheduled colonoscopy.                           - Observe patient's clinical course.                           - Continue present medications.                           - Await pathology results.                           - The findings and recommendations were discussed  with the patient. Justice Britain, MD 11/01/2018 3:26:47 PM

## 2018-11-01 NOTE — Patient Instructions (Signed)
Impression/Recommendations:  Hemorrhoid handout given to patient. Polyp handout given to patient. High fiber diet handout given to patient. Hemorrhoid handout given to patient.  Continue present medications. Await pathology results.  Repeat colonoscopy for surveillance.  Date to be determined after pathology results reviewed.  Return to GI clinic as previously scheduled.  YOU HAD AN ENDOSCOPIC PROCEDURE TODAY AT [facility]:   Refer to the procedure report that was given to you for any specific questions about what was found during the examination.  If the procedure report does not answer your questions, please call your gastroenterologist to clarify.  If you requested that your care partner not be given the details of your procedure findings, then the procedure report has been included in a sealed envelope for you to review at your convenience later.  YOU SHOULD EXPECT: Some feelings of bloating in the abdomen. Passage of more gas than usual.  Walking can help get rid of the air that was put into your GI tract during the procedure and reduce the bloating. If you had a lower endoscopy (such as a colonoscopy or flexible sigmoidoscopy) you may notice spotting of blood in your stool or on the toilet paper. If you underwent a bowel prep for your procedure, you may not have a normal bowel movement for a few days.  Please Note:  You might notice some irritation and congestion in your nose or some drainage.  This is from the oxygen used during your procedure.  There is no need for concern and it should clear up in a day or so.  SYMPTOMS TO REPORT IMMEDIATELY:   Following lower endoscopy (colonoscopy or flexible sigmoidoscopy):  Excessive amounts of blood in the stool  Significant tenderness or worsening of abdominal pains  Swelling of the abdomen that is new, acute  Fever of 100F or higher   Following upper endoscopy (EGD)  Vomiting of blood or coffee ground material  New  chest pain or pain under the shoulder blades  Painful or persistently difficult swallowing  New shortness of breath  Fever of 100F or higher  Black, tarry-looking stools  For urgent or emergent issues, a gastroenterologist can be reached at any hour by calling [phone].   DIET:  We do recommend a small meal at first, but then you may proceed to your regular diet.  Drink plenty of fluids but you should avoid alcoholic beverages for 24 hours.  ACTIVITY:  You should plan to take it easy for the rest of today and you should NOT DRIVE or use heavy machinery until tomorrow (because of the sedation medicines used during the test).    FOLLOW UP: Our staff will call the number listed on your records 48-72 hours following your procedure to check on you and address any questions or concerns that you may have regarding the information given to you following your procedure. If we do not reach you, we will leave a message.  We will attempt to reach you two times.  During this call, we will ask if you have developed any symptoms of COVID 19. If you develop any symptoms (ie: fever, flu-like symptoms, shortness of breath, cough etc.) before then, please call [phone].  If you test positive for Covid 19 in the 2 weeks post procedure, please call and report this information to [place].    If any biopsies were taken you will be contacted by phone or by letter within the next 1-3 weeks.  Please call us at [phone]  if you have not heard about the biopsies in 3 weeks.    SIGNATURES/CONFIDENTIALITY: You and/or your care partner have signed paperwork which will be entered into your electronic medical record.  These signatures attest to the fact that that the information above on your After Visit Summary has been reviewed and is understood.  Full responsibility of the confidentiality of this discharge information lies with you and/or your care-partner.

## 2018-11-01 NOTE — Progress Notes (Signed)
A and O x3. Report to RN. Tolerated MAC anesthesia well.Teeth unchanged after procedure.

## 2018-11-01 NOTE — Progress Notes (Signed)
[name] VS [name]

## 2018-11-01 NOTE — Progress Notes (Signed)
Called to room to assist during endoscopic procedure.  Patient ID and intended procedure confirmed with present staff. Received instructions for my participation in the procedure from the performing physician.  

## 2018-11-06 ENCOUNTER — Telehealth: Payer: Self-pay | Admitting: *Deleted

## 2018-11-06 NOTE — Telephone Encounter (Signed)
  Follow up Call-  Call back number 11/01/2018  Post procedure Call Back phone  # [phone]  Permission to leave phone message Yes  Some recent data might be hidden     Patient questions:  Do you have a fever, pain , or abdominal swelling? No. Pain Score  0 *  Have you tolerated food without any problems? Yes.    Have you been able to return to your normal activities? Yes.    Do you have any questions about your discharge instructions: Diet   No. Medications  No. Follow up visit  No.  Do you have questions or concerns about your Care? No.  Actions: * If pain score is 4 or above: No action needed, pain <4.  1. Have you developed a fever since your procedure? no  2.   Have you had an respiratory symptoms (SOB or cough) since your procedure? no  3.   Have you tested positive for COVID 19 since your procedure no  4.   Have you had any family members/close contacts diagnosed with the COVID 19 since your procedure?  no   If yes to any of these questions please route to Joylene John, RN and Alphonsa Gin, Therapist, sports.

## 2018-11-08 ENCOUNTER — Encounter: Payer: Self-pay | Admitting: Gastroenterology

## 2018-11-08 ENCOUNTER — Telehealth: Payer: Self-pay

## 2018-11-08 DIAGNOSIS — R6881 Early satiety: Secondary | ICD-10-CM

## 2018-11-08 MED ORDER — OMEPRAZOLE 40 MG PO CPDR: 40.0000 mg | DELAYED_RELEASE_CAPSULE | Freq: Two times a day (BID) | ORAL | 3 refills | Status: DC

## 2018-11-08 NOTE — Telephone Encounter (Signed)
-----   Message from Irving Copas., MD sent at 11/08/2018  4:52 AM EDT ----- Regarding: Follow-up Vincent Hernandez,This patient's letter will be sent by our [facility] RN.  However he needs to increase his omeprazole to twice daily dosing as a result of the findings of lymphocytes and eosinophils in his esophagus biopsies randomly.  He is also been needed follow-up endoscopy which they should set up for 2 months from now.  Please set him up for a solid-phase gastric emptying study and ensure that he is not on any narcotics for at least 72 hours before the procedure.  Let him know that if this is unremarkable we will proceed with a CT abdomen/pelvis.Thank you.GM

## 2018-11-08 NOTE — Telephone Encounter (Signed)
You have been scheduled for a gastric emptying scan at [facility] on 11/18/18 at 730 am. Please arrive at least 30 minutes prior to your appointment for registration. Please make certain not to have anything to eat or drink after midnight the night before your test. Hold all stomach medications (ex: Zofran, phenergan, Reglan) 48 hours prior to your test. If you need to reschedule your appointment, please contact radiology scheduling at [phone].  A gastric-emptying study measures how long it takes for food to move through your stomach. There are several ways to measure stomach emptying. In the most common test, you eat food that contains a small amount of radioactive material. A scanner that detects the movement of the radioactive material is placed over your abdomen to monitor the rate at which food leaves your stomach. This test normally takes about 4 hours to complete.

## 2018-11-08 NOTE — Addendum Note (Signed)
Addended by: Timothy Lasso on: 11/08/2018 10:38 AM   Modules accepted: Orders

## 2018-11-18 ENCOUNTER — Encounter (HOSPITAL_COMMUNITY): Payer: BC Managed Care – PPO

## 2019-04-21 ENCOUNTER — Other Ambulatory Visit: Payer: Self-pay

## 2019-04-22 ENCOUNTER — Encounter: Payer: Self-pay | Admitting: Family Medicine

## 2019-04-22 ENCOUNTER — Ambulatory Visit (INDEPENDENT_AMBULATORY_CARE_PROVIDER_SITE_OTHER): Payer: BC Managed Care – PPO | Admitting: Family Medicine

## 2019-04-22 DIAGNOSIS — G43909 Migraine, unspecified, not intractable, without status migrainosus: Secondary | ICD-10-CM | POA: Diagnosis not present

## 2019-04-22 DIAGNOSIS — M5020 Other cervical disc displacement, unspecified cervical region: Secondary | ICD-10-CM | POA: Diagnosis not present

## 2019-04-22 MED ORDER — AMOXICILLIN-POT CLAVULANATE 875-125 MG PO TABS: 1.0000 | ORAL_TABLET | Freq: Two times a day (BID) | ORAL | 0 refills | Status: DC

## 2019-04-22 MED ORDER — PREDNISONE 50 MG PO TABS: ORAL_TABLET | ORAL | 0 refills | Status: DC

## 2019-04-22 MED ORDER — CYCLOBENZAPRINE HCL 10 MG PO TABS: 10.0000 mg | ORAL_TABLET | Freq: Three times a day (TID) | ORAL | 0 refills | Status: DC | PRN

## 2019-04-22 NOTE — Assessment & Plan Note (Signed)
Worsening.  No red flags.  Pain consistent with past migraines.  Will treat with a sinus infection with a course of Augmentin prednisone.  Hopefully this will help with his current migraine as well.  We will also refill Flexeril as noted above.  Will place referral to neurology for further evaluation.  Discussed reasons to return to care.

## 2019-04-22 NOTE — Progress Notes (Signed)
   Vincent Hernandez is a 54 y.o. male who presents today for a virtual office visit.  Assessment/Plan:  New/Acute Problems: Sinusitis Given prolonged course will start course of Augmentin and prednisone.  Hopefully will help with migraine as well.  Chronic Problems Addressed Today: Herniated disc, cervical Possibly contributing to migraines.  Will refill Flexeril today.  She also have some improvement with prednisone.  Migraine headache Worsening.  No red flags.  Pain consistent with past migraines.  Will treat with a sinus infection with a course of Augmentin prednisone.  Hopefully this will help with his current migraine as well.  We will also refill Flexeril as noted above.  Will place referral to neurology for further evaluation.  Discussed reasons to return to care.     Subjective:  HPI:  Patient has had ongoing migraine for the past 4 weeks.  Similar characteristic to previous migraines.  No nausea or vomiting.  No weakness or numbness.  Has had some visual scotomas which is normal for him.  He has had increased stress.  Possibly also dealing with a sinus infection.  He has had some increased drainage and sinus congestion over the last week or so.  No reported fevers or chills.  Has tried using Maxalt with no improvement.  Migraines seem to be worsening over the past year.        Objective/Observations  Physical Exam: Gen: NAD, resting comfortably Pulm: Normal work of breathing Neuro: Grossly normal, moves all extremities Psych: Normal affect and thought content  Virtual Visit via Video   I connected with Vincent Hernandez on 04/22/19 at  1:20 PM EST by a video enabled telemedicine application and verified that I am speaking with the correct person using two identifiers. The limitations of evaluation and management by telemedicine and the availability of in person appointments were discussed. The patient expressed understanding and agreed to proceed.   Patient location: [employer] [place] location: [place] participating in the virtual visit: Myself and Patient     Algis Greenhouse. Jerline Pain, MD 04/22/2019 1:43 PM

## 2019-04-22 NOTE — Assessment & Plan Note (Signed)
Possibly contributing to migraines.  Will refill Flexeril today.  She also have some improvement with prednisone.

## 2019-04-24 ENCOUNTER — Encounter: Payer: Self-pay | Admitting: Neurology

## 2019-06-15 NOTE — Progress Notes (Signed)
NEUROLOGY CONSULTATION NOTE  [name] MRN: [id number] DOB: 08-15-1965  Referring provider: Dimas Chyle, MD Primary care provider: Dimas Chyle, MD  Reason for consult:  migraines  HISTORY OF PRESENT ILLNESS: Vincent Hernandez. Vincent Hernandez is a 54 year old right-handed male who presents for migraines.  History supplemented by referring provider's note.  Past 2 months.  1990s. Chronic Stress trigger oxycontin- years ago. Sumatriptan, rizatriptan (helped), naproxen 500mg  Off and on for 3 months Left eye to both eyes Nausea, dizziness, photo, blurred vision 2 to 3 days.  Every 3 days Prior to 3 months 1 to 2 a month  He had history of migraines since his 84s.  They are a severe sharp pain that typically begins in his left eye and then radiates to his right eye.  He has some visual scotoma and sometimes associated with nausea, dizziness, photophobia and blurred vision.  They typically last 3 days and would occur twice a month.  He reports worsening migraines for the past 3 months, occurring about every 3 days.  He endorses increased emotional stress.  As he was reporting sinus congestion and drainage, he was prescribed prednisone and Augmentin for possible sinusitis.    He has history of C7-T1 anterior cervical spine fusion in 2017, as noted on cervical spine X-ray personally reviewed.   Current NSAIDS:  none Current analgesics:  none Current triptans:  Rizatriptan 10mg  Current ergotamine:  none Current anti-emetic:  Zofran ODT 8mg  Current muscle relaxants:  Flexeril 25m Current anti-anxiolytic:  none Current sleep aide:  none Current Antihypertensive medications:  none Current Antidepressant medications:  none Current Anticonvulsant medications:  none Current anti-CGRP:  none Current Vitamins/Herbal/Supplements:  C; B6 Current Antihistamines/Decongestants:  none Other therapy:  none Hormone/birth control:  none  Past NSAIDS:  naproxen Past analgesics:  Excedrin Migraine Past  abortive triptans:  Sumatriptan 50mg  Past abortive ergotamine:  none Past muscle relaxants:  baclofen Past anti-emetic:  none Past antihypertensive medications:  none Past antidepressant medications:  none Past anticonvulsant medications:  Lyrica Past anti-CGRP:  none Past vitamins/Herbal/Supplements:  none Past antihistamines/decongestants:  none Other past therapies:  none  Caffeine:  No coffee or soda Diet:  Drinks water all day.  Milk.  Does not skip meals Exercise:  Not routine Depression:  no; Anxiety:  some Other pain:  Chronic:  Knees, arthritis Sleep:  variable Family history of headache:  no   PAST MEDICAL HISTORY: Past Medical History:  Diagnosis Date  . Arthritis   . GERD (gastroesophageal reflux disease)    pt. denies  . Herniated disc, cervical    through T1  . Migraines     PAST SURGICAL HISTORY: Past Surgical History:  Procedure Laterality Date  . ANTERIOR CERVICAL DECOMP/DISCECTOMY FUSION N/A 09/01/2015   Procedure: ACDF C7-T1;  Surgeon: Melina Schools, MD;  Location: [facility] [place];  Service: Orthopedics;  Laterality: N/A;  . BICEPS TENODESIS Right 06/19/2017   Dr. Theda Sers  . CARPAL TUNNEL RELEASE Bilateral    left 2016, right 2016  . FOOT ARTHROPLASTY Right 2011   screws  . GANGLION CYST EXCISION    . MENISCUS REPAIR Left 2016  . ORIF TOE FRACTURE Left 11/21/2012   Procedure: OPEN REDUCTION INTERNAL FIXATION (ORIF) LEFT FOURTH METATARSAL NONUNION FRACTURE;  Surgeon: Wylene Simmer, MD;  Location: [facility];  Service: Orthopedics;  Laterality: Left;  . REPAIR QUADRICEPS / HAMSTRING MUSCLE Right 2014  . ROTATOR CUFF REPAIR Right 06/19/2017   Dr. Theda Sers  . SEPTOPLASTY  1995  x 3 nose surgeries  . SHOULDER ARTHROSCOPY W/ ROTATOR CUFF REPAIR Left 2013  . TOTAL KNEE ARTHROPLASTY Right 05/04/2017   Procedure: RIGHT TOTAL KNEE ARTHROPLASTY;  Surgeon: Sydnee Cabal, MD;  Location: [facility];  Service: Orthopedics;  Laterality: Right;  Adductor Block  .  TOTAL KNEE ARTHROPLASTY Left 11/16/2017   Procedure: LEFT TOTAL KNEE ARTHROPLASTY;  Surgeon: Sydnee Cabal, MD;  Location: [facility];  Service: Orthopedics;  Laterality: Left;    MEDICATIONS: Current Outpatient Medications on File Prior to Visit  Medication Sig Dispense Refill  . amoxicillin-clavulanate (AUGMENTIN) 875-125 MG tablet Take 1 tablet by mouth 2 (two) times daily. 20 tablet 0  . cyclobenzaprine (FLEXERIL) 10 MG tablet Take 1 tablet (10 mg total) by mouth 3 (three) times daily as needed for muscle spasms. 90 tablet 0  . Echinacea 400 MG CAPS Take 800 mg by mouth daily.    . Glucosamine-MSM-Hyaluronic Acd (JOINT HEALTH PO) Take 2 tablets by mouth daily.    . Multiple Vitamin (MULTIVITAMIN WITH MINERALS) TABS tablet Take 1 tablet by mouth daily.    . Omega-3 Fatty Acids (FISH OIL) 1000 MG CAPS Take 4,000 mg by mouth daily.    . ondansetron (ZOFRAN ODT) 8 MG disintegrating tablet Take 1 tablet (8 mg total) by mouth every 8 (eight) hours as needed for nausea or vomiting. 30 tablet 1  . predniSONE (DELTASONE) 50 MG tablet Take 1 tablet daily for 5 days. 5 tablet 0  . pyridOXINE (VITAMIN B-6) 100 MG tablet Take 100 mg by mouth daily.    . rizatriptan (MAXALT) 10 MG tablet Take 1 tablet (10 mg total) by mouth as needed for migraine. May repeat in 2 hours if needed 30 tablet 3  . SUPER B COMPLEX/C CAPS Take 1 capsule by mouth daily.    . vitamin C (ASCORBIC ACID) 500 MG tablet Take 500 mg by mouth daily.     Current Facility-Administered Medications on File Prior to Visit  Medication Dose Route Frequency Provider Last Rate Last Admin  . 0.9 %  sodium chloride infusion  500 mL Intravenous Once Mansouraty, Telford Nab., MD        ALLERGIES: Allergies  Allergen Reactions  . Robaxin [Methocarbamol] Itching  . Tdap [Tetanus-Diphth-Acell Pertussis] Other (See Comments)    "Locked up" aches and pains    FAMILY HISTORY: Family History  Problem Relation Age of Onset  . Stroke Father 36        1973  . Lung cancer Father   . Colon cancer Father   . Hypertension Mother   . Kidney cancer Mother        dialysis  . Diabetes Paternal Grandmother   . Diabetes Paternal Aunt   . Diabetes Paternal Uncle   . Esophageal cancer Neg Hx   . Inflammatory bowel disease Neg Hx   . Liver disease Neg Hx   . Pancreatic cancer Neg Hx   . Stomach cancer Neg Hx     SOCIAL HISTORY: Social History   Socioeconomic History  . Marital status: Legally Separated    Spouse name: Rodena Piety  . Number of children: 3  . Years of education: 74  . Highest education level: Not on file  Occupational History  . Occupation: Physiological scientist    Comment: [employer]  Tobacco Use  . Smoking status: Former Smoker    Types: Cigarettes  . Smokeless tobacco: Never Used  . 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  Alcohol use: Yes    Alcohol/week: 0.0 standard drinks    Comment: seldom  . Drug use: No  . Sexual activity: Yes    Partners: Female  Other Topics Concern  . Not on file  Social History Narrative   Lives with his wife and 4 dogs (3 Benny Lennert and a [place]).   Adult children live in [place] and [place].   Social Determinants of Health   Financial Resource Strain:   . Difficulty of Paying Living Expenses:   Food Insecurity:   . Worried About Charity fundraiser in the Last Year:   . Arboriculturist in the Last Year:   Transportation Needs:   . Film/video editor (Medical):   Marland Kitchen Lack of Transportation (Non-Medical):   Physical Activity:   . Days of Exercise per Week:   . Minutes of Exercise per Session:   Stress:   . Feeling of Stress :   Social Connections:   . Frequency of Communication with Friends and Family:   . Frequency of Social Gatherings with Friends and Family:   . Attends Religious Services:   . Active Member of Clubs or Organizations:   . Attends Archivist Meetings:   Marland Kitchen Marital Status:     Intimate Partner Violence:   . Fear of Current or Ex-Partner:   . Emotionally Abused:   Marland Kitchen Physically Abused:   . Sexually Abused:    PHYSICAL EXAM: Blood pressure 118/70, height 6\' 5"  (1.956 m), weight 265 lb 8 oz (120.4 kg). General: No acute distress.  Patient appears well-groomed.   Head:  Normocephalic/atraumatic Eyes:  fundi examined but not visualized Neck: supple, no paraspinal tenderness, full range of motion Back: No paraspinal tenderness Heart: regular rate and rhythm Lungs: Clear to auscultation bilaterally. Vascular: No carotid bruits. Neurological Exam: Mental status: alert and oriented to person, place, and time, recent and remote memory intact, fund of knowledge intact, attention and concentration intact, speech fluent and not dysarthric, language intact. Cranial nerves: CN I: not tested CN II: pupils equal, round and reactive to light, visual fields intact CN III, IV, VI:  full range of motion, no nystagmus, no ptosis CN V: facial sensation intact CN VII: upper and lower face symmetric CN VIII: hearing intact CN IX, X: gag intact, uvula midline CN XI: sternocleidomastoid and trapezius muscles intact CN XII: tongue midline Bulk & Tone: normal, no fasciculations. Motor:  5/5 throughout  Sensation: temperature and vibration sensation intact. Deep Tendon Reflexes:  2+ throughout, toes downgoing.  Finger to nose testing:  Without dysmetria.  Heel to shin:  Without dysmetria.  Gait:  Normal station and stride.  Able to turn and tandem walk. Romberg negative.  IMPRESSION: Chronic migraine without aura, without status migrainosus, intractable Migraine with aura  PLAN: 1.  For preventative management, start nortriptyline 10mg  at bedtime for one week, then 20mg  at bedtime 2.  For abortive therapy, Nurtec (samples provided).  Stop rizatriptan 3.  Limit use of pain relievers to no more than 2 days out of week to prevent risk of rebound or medication-overuse  headache. 4.  Keep headache diary 5.  Exercise, hydration, caffeine cessation, sleep hygiene, monitor for and avoid triggers 6. Follow up 4 months.   Thank you for allowing me to take part in the care of this patient.  Metta Clines, DO  CC: Dimas Chyle, MD

## 2019-06-16 ENCOUNTER — Ambulatory Visit: Payer: BC Managed Care – PPO | Admitting: Neurology

## 2019-06-16 ENCOUNTER — Other Ambulatory Visit: Payer: Self-pay

## 2019-06-16 ENCOUNTER — Encounter: Payer: Self-pay | Admitting: Neurology

## 2019-06-16 VITALS — BP 118/70 | Ht 77.0 in | Wt 265.5 lb

## 2019-06-16 DIAGNOSIS — G43719 Chronic migraine without aura, intractable, without status migrainosus: Secondary | ICD-10-CM

## 2019-06-16 DIAGNOSIS — G43119 Migraine with aura, intractable, without status migrainosus: Secondary | ICD-10-CM

## 2019-06-16 MED ORDER — NORTRIPTYLINE HCL 10 MG PO CAPS: ORAL_CAPSULE | ORAL | 0 refills | Status: DC

## 2019-06-16 NOTE — Patient Instructions (Signed)
Migraine Recommendations: 1.  Start nortriptyline 10mg .  Take 1 capsule at bedtime for a week, then 2 capsules at bedtime.  Contact me in 5 weeks with update and we can adjust dose if needed. 2.  Take Nurtec 1 tablet at earliest onset of headache.  Do not exceed 1 tablet in 24 hours. 3.  Limit use of pain relievers to no more than 2 days out of the week.  These medications include acetaminophen, ibuprofen, triptans and narcotics.  This will help reduce risk of rebound headaches. 4.  Be aware of common food triggers such as processed sweets, processed foods with nitrites (such as deli meat, hot dogs, sausages), foods with MSG, alcohol (such as wine), chocolate, certain cheeses, certain fruits (dried fruits, bananas, pineapple), vinegar, diet soda. 4.  Avoid caffeine 5.  Routine exercise 6.  Proper sleep hygiene 7.  Stay adequately hydrated with water 8.  Keep a headache diary. 9.  Maintain proper stress management. 10.  Do not skip meals. 11.  Consider supplements:  Magnesium citrate 400mg  to 600mg  daily, riboflavin 400mg , Coenzyme Q 10 100mg  three times daily 12.  Follow up in 4 months.

## 2019-06-26 ENCOUNTER — Telehealth: Payer: Self-pay | Admitting: Neurology

## 2019-06-26 NOTE — Telephone Encounter (Signed)
Patient called in stating the samples he received at his last visit have been causing digestive upset and have not been working for his headaches. He would like to see if there is something else he could try.

## 2019-06-26 NOTE — Telephone Encounter (Signed)
We can place a prescription for Ubrelvy 100mg  tablet:  take 1 tablet for migraine.  May repeat in 2 hours if needed.  Maximum 2 tablets in 24 hours.  Quantity 10, refills 5.  It will require a prior authorization so he may not be able to pick it up right away.  Otherwise, he may come by the office to pick up samples to try (4 with copay card) first/immediately.  If effective, then we can put in a prescription.

## 2019-06-26 NOTE — Telephone Encounter (Signed)
Telephone call to pt. Advised of Dr. Tomi Likens note. Pt will let us know if he likes the mediatation.

## 2019-10-20 ENCOUNTER — Ambulatory Visit: Payer: BC Managed Care – PPO | Admitting: Neurology

## 2019-10-20 DIAGNOSIS — M79672 Pain in left foot: Secondary | ICD-10-CM | POA: Diagnosis not present

## 2019-11-12 DIAGNOSIS — T8484XA Pain due to internal orthopedic prosthetic devices, implants and grafts, initial encounter: Secondary | ICD-10-CM | POA: Diagnosis not present

## 2019-11-12 DIAGNOSIS — M14672 Charcot's joint, left ankle and foot: Secondary | ICD-10-CM | POA: Diagnosis not present

## 2019-11-20 ENCOUNTER — Other Ambulatory Visit: Payer: Self-pay | Admitting: Family Medicine

## 2019-11-29 DIAGNOSIS — M79672 Pain in left foot: Secondary | ICD-10-CM | POA: Diagnosis not present

## 2019-12-10 DIAGNOSIS — M19072 Primary osteoarthritis, left ankle and foot: Secondary | ICD-10-CM | POA: Diagnosis not present

## 2019-12-22 ENCOUNTER — Other Ambulatory Visit: Payer: Self-pay | Admitting: Family Medicine

## 2020-01-09 ENCOUNTER — Ambulatory Visit: Payer: BC Managed Care – PPO | Admitting: Neurology

## 2020-02-18 DIAGNOSIS — Z20822 Contact with and (suspected) exposure to covid-19: Secondary | ICD-10-CM | POA: Diagnosis not present

## 2020-02-29 ENCOUNTER — Other Ambulatory Visit: Payer: Self-pay | Admitting: Family Medicine

## 2020-03-01 ENCOUNTER — Telehealth: Payer: Self-pay | Admitting: *Deleted

## 2020-03-01 NOTE — Telephone Encounter (Signed)
Left message to return call to our office at their convenience. Pt needs to schedule OV for medicine refills

## 2020-03-09 ENCOUNTER — Other Ambulatory Visit: Payer: Self-pay

## 2020-03-09 ENCOUNTER — Ambulatory Visit: Payer: BC Managed Care – PPO | Admitting: Family Medicine

## 2020-03-09 ENCOUNTER — Encounter: Payer: Self-pay | Admitting: Family Medicine

## 2020-03-09 VITALS — BP 125/71 | HR 73 | Temp 97.9°F | Ht 69.0 in | Wt 267.2 lb

## 2020-03-09 DIAGNOSIS — G43909 Migraine, unspecified, not intractable, without status migrainosus: Secondary | ICD-10-CM | POA: Diagnosis not present

## 2020-03-09 DIAGNOSIS — E785 Hyperlipidemia, unspecified: Secondary | ICD-10-CM

## 2020-03-09 DIAGNOSIS — M5020 Other cervical disc displacement, unspecified cervical region: Secondary | ICD-10-CM | POA: Diagnosis not present

## 2020-03-09 MED ORDER — CYCLOBENZAPRINE HCL 10 MG PO TABS: ORAL_TABLET | ORAL | 0 refills | Status: DC

## 2020-03-09 MED ORDER — RIZATRIPTAN BENZOATE 10 MG PO TABS: ORAL_TABLET | ORAL | 3 refills | Status: DC

## 2020-03-09 NOTE — Assessment & Plan Note (Signed)
Likely contributing to migraines.  Will refill Flexeril today.  No red flags.

## 2020-03-09 NOTE — Assessment & Plan Note (Signed)
Check CBC, CMET, lipids.

## 2020-03-09 NOTE — Progress Notes (Signed)
   Vincent Hernandez is a 55 y.o. male who presents today for an office visit.  Assessment/Plan:  Chronic Problems Addressed Today: Herniated disc, cervical Likely contributing to migraines.  Will refill Flexeril today.  No red flags.  Migraine headache Symptoms are overall stable.  Migraines stress-induced.  Will refill Maxalt today.  Dyslipidemia Check CBC, CMET, lipids.    Subjective:  HPI:   See A/p.         Objective:  Physical Exam: BP 125/71   Pulse 73   Temp 97.9 F (36.6 C) (Temporal)   Ht 5\' 9"  (1.753 m)   Wt 267 lb 3.2 oz (121.2 kg)   SpO2 97%   BMI 39.46 kg/m   Wt Readings from Last 3 Encounters:  03/09/20 267 lb 3.2 oz (121.2 kg)  06/16/19 265 lb 8 oz (120.4 kg)  11/01/18 251 lb (113.9 kg)  Gen: No acute distress, resting comfortably CV: Regular rate and rhythm with no murmurs appreciated Pulm: Normal work of breathing, clear to auscultation bilaterally with no crackles, wheezes, or rhonchi Neuro: Grossly normal, moves all extremities Psych: Normal affect and thought content      Ramonita Koenig M. Jerline Pain, MD 03/09/2020 4:03 PM

## 2020-03-09 NOTE — Patient Instructions (Signed)
It was very nice to see you today!  I will refill your medications.  We will check blood work today.  Like to see back in about a year or so for your annual checkup with labs.  Please come back to see me sooner if needed.  Take care, Dr Jerline Pain  Please try these tips to maintain a healthy lifestyle:   Eat at least 3 REAL meals and 1-2 snacks per day.  Aim for no more than 5 hours between eating.  If you eat breakfast, please do so within one hour of getting up.    Each meal should contain half fruits/vegetables, one quarter protein, and one quarter carbs (no bigger than a computer mouse)   Cut down on sweet beverages. This includes juice, soda, and sweet tea.     Drink at least 1 glass of water with each meal and aim for at least 8 glasses per day   Exercise at least 150 minutes every week.

## 2020-03-09 NOTE — Assessment & Plan Note (Signed)
Symptoms are overall stable.  Migraines stress-induced.  Will refill Maxalt today.

## 2020-03-10 LAB — LIPID PANEL
Cholesterol: 149 mg/dL (ref 0–200)
HDL: 39.5 mg/dL (ref >39.00)
LDL Cholesterol: 93 mg/dL (ref 0–99)
NonHDL: 109.86
Total CHOL/HDL Ratio: 4
Triglycerides: 83 mg/dL (ref 0.0–149.0)
VLDL: 16.6 mg/dL (ref 0.0–40.0)

## 2020-03-10 LAB — COMPREHENSIVE METABOLIC PANEL
ALT: 32 U/L (ref 0–53)
AST: 27 U/L (ref 0–37)
Albumin: 4.1 g/dL (ref 3.5–5.2)
Alkaline Phosphatase: 62 U/L (ref 39–117)
BUN: 13 mg/dL (ref 6–23)
CO2: 31 mEq/L (ref 19–32)
Calcium: 8.9 mg/dL (ref 8.4–10.5)
Chloride: 103 mEq/L (ref 96–112)
Creatinine, Ser: 0.78 mg/dL (ref 0.40–1.50)
GFR: 100.16 mL/min (ref >60.00)
Glucose, Bld: 98 mg/dL (ref 70–99)
Potassium: 3.6 mEq/L (ref 3.5–5.1)
Sodium: 138 mEq/L (ref 135–145)
Total Bilirubin: 0.7 mg/dL (ref 0.2–1.2)
Total Protein: 7.1 g/dL (ref 6.0–8.3)

## 2020-03-10 LAB — CBC
HCT: 40.9 % (ref 39.0–52.0)
Hemoglobin: 14.2 g/dL (ref 13.0–17.0)
MCHC: 34.8 g/dL (ref 30.0–36.0)
MCV: 93.1 fl (ref 78.0–100.0)
Platelets: 325 10*3/uL (ref 150.0–400.0)
RBC: 4.39 Mil/uL (ref 4.22–5.81)
RDW: 12.7 % (ref 11.5–15.5)
WBC: 8.5 10*3/uL (ref 4.0–10.5)

## 2020-03-11 NOTE — Progress Notes (Signed)
Please inform patient of the following:  Blood work all normal. Do not need to make any changes to his treatment plan at this time. Would like for him to keep working on diet and exercise and we can recheck in a year or so.  Vincent Hernandez. Jerline Pain, MD 03/11/2020 8:09 AM

## 2020-04-26 ENCOUNTER — Telehealth (INDEPENDENT_AMBULATORY_CARE_PROVIDER_SITE_OTHER): Payer: BC Managed Care – PPO | Admitting: Family Medicine

## 2020-04-26 ENCOUNTER — Encounter: Payer: Self-pay | Admitting: Family Medicine

## 2020-04-26 VITALS — Ht 69.0 in | Wt 260.0 lb

## 2020-04-26 DIAGNOSIS — J329 Chronic sinusitis, unspecified: Secondary | ICD-10-CM | POA: Diagnosis not present

## 2020-04-26 MED ORDER — PREDNISONE 50 MG PO TABS: ORAL_TABLET | ORAL | 0 refills | Status: DC

## 2020-04-26 MED ORDER — AMOXICILLIN-POT CLAVULANATE 875-125 MG PO TABS: 1.0000 | ORAL_TABLET | Freq: Two times a day (BID) | ORAL | 0 refills | Status: DC

## 2020-04-26 NOTE — Progress Notes (Signed)
   Vincent Hernandez is a 55 y.o. male who presents today for a virtual office visit.  Assessment/Plan:  Sinusitis No red flags.  Given length of symptoms will start Augmentin and prednisone.  This is worked well for him in the past.  It is not want to try nasal spray.  These have caused more harm than help in the past.  Encourage good oral hydration.  Can continue using over-the-counter meds as needed.  Discussed reasons to return to care.  Follow-up as needed.    Subjective:  HPI:  Patient with sore throat and sinus congestion.  Sinus congestion has been intermittent for the last several weeks.  Feels like previous sinus infections.  Yesterday started having more sore throat.  He has tried Chloraseptic spray with no improvement.  No fevers or chills.  No worsening migraines.  No reported visual changes.  No known sick contacts.       Objective/Observations  Physical Exam: Gen: NAD, resting comfortably Pulm: Normal work of breathing Neuro: Grossly normal, moves all extremities Psych: Normal affect and thought content  Virtual Visit via Video   I connected with Vincent Hernandez on 04/26/20 at 10:40 AM EST by a video enabled telemedicine application and verified that I am speaking with the correct person using two identifiers. The limitations of evaluation and management by telemedicine and the availability of in person appointments were discussed. The patient expressed understanding and agreed to proceed.   Patient location: Home Provider location: [place] participating in the virtual visit: Myself and Patient     Vincent Hernandez. Jerline Pain, MD 04/26/2020 10:51 AM

## 2020-04-30 DIAGNOSIS — Z1211 Encounter for screening for malignant neoplasm of colon: Secondary | ICD-10-CM | POA: Diagnosis not present

## 2020-04-30 DIAGNOSIS — Z0000 Encounter for general adult medical examination without abnormal findings: Secondary | ICD-10-CM | POA: Diagnosis not present

## 2020-04-30 DIAGNOSIS — S46212S Strain of muscle, fascia and tendon of other parts of biceps, left arm, sequela: Secondary | ICD-10-CM | POA: Diagnosis not present

## 2020-04-30 DIAGNOSIS — Z1212 Encounter for screening for malignant neoplasm of rectum: Secondary | ICD-10-CM | POA: Diagnosis not present

## 2020-04-30 DIAGNOSIS — R972 Elevated prostate specific antigen [PSA]: Secondary | ICD-10-CM | POA: Diagnosis not present

## 2020-04-30 DIAGNOSIS — R030 Elevated blood-pressure reading, without diagnosis of hypertension: Secondary | ICD-10-CM | POA: Diagnosis not present

## 2020-05-04 ENCOUNTER — Ambulatory Visit: Payer: BC Managed Care – PPO | Admitting: Family Medicine

## 2020-05-26 ENCOUNTER — Encounter: Payer: Self-pay | Admitting: Family Medicine

## 2020-05-27 ENCOUNTER — Other Ambulatory Visit: Payer: Self-pay

## 2020-05-27 ENCOUNTER — Encounter: Payer: Self-pay | Admitting: Family Medicine

## 2020-05-27 ENCOUNTER — Ambulatory Visit: Payer: BC Managed Care – PPO | Admitting: Family Medicine

## 2020-05-27 VITALS — BP 110/70 | HR 66 | Temp 97.9°F | Ht 69.0 in | Wt 267.8 lb

## 2020-05-27 DIAGNOSIS — H029 Unspecified disorder of eyelid: Secondary | ICD-10-CM | POA: Diagnosis not present

## 2020-05-27 DIAGNOSIS — G43909 Migraine, unspecified, not intractable, without status migrainosus: Secondary | ICD-10-CM

## 2020-05-27 MED ORDER — BACITRACIN-POLYMYXIN B 500-10000 UNIT/GM OP OINT: 1.0000 "application " | TOPICAL_OINTMENT | Freq: Two times a day (BID) | OPHTHALMIC | 0 refills | Status: DC

## 2020-05-27 NOTE — Progress Notes (Signed)
   Vincent Hernandez is a 55 y.o. male who presents today for an office visit.  Assessment/Plan:  Eyelid lesion Likely hordeolum.  Given that is been present for 2 weeks and if not resolving will place referral to ophthalmology for consideration for incision and drainage or excision.  Will start topical Polytrim in the meantime.  Differential also potentially includes basal cell carcinoma or keratoacanthoma -we will defer potential need for biopsy to ophthalmology.    Subjective:  HPI:  Patient with eyelid lesion for the last few weeks.  Located on left upper eyelid.  Initially started as a pimple.  He thought was a stye.  He thought he would give it a week or so however symptoms have persisted.  Some pain to the area.  No reported vision changes.  He is otherwise doing well.       Objective:  Physical Exam: Ht 5\' 9"  (1.753 m)   BMI 38.40 kg/m   Gen: No acute distress, resting comfortably HEENT: Left upper eyelid with approximately 4 mm erythematous nodule.  Central ulceration noted.  No surrounding erythema or edema Neuro: Grossly normal, moves all extremities Psych: Normal affect and thought content      Jaedyn Marrufo M. Jerline Pain, MD 05/27/2020 10:45 AM

## 2020-05-27 NOTE — Patient Instructions (Signed)
It was very nice to see you today!  Please try the antibiotic ointment twice daily for the next week or 2.  I believe we will need to have you see a eye surgeon to have the area cut out.  I will place this referral today.  If your symptoms worsen before you see the eye surgeon please let us know.  Take care, Dr Jerline Pain  PLEASE NOTE:  If you had any lab tests please let us know if you have not heard back within a few days. You may see your results on mychart before we have a chance to review them but we will give you a call once they are reviewed by [place]. If we ordered any referrals today, please let us know if you have not heard from their office within the next week.   Please try these tips to maintain a healthy lifestyle:   Eat at least 3 REAL meals and 1-2 snacks per day.  Aim for no more than 5 hours between eating.  If you eat breakfast, please do so within one hour of getting up.    Each meal should contain half fruits/vegetables, one quarter protein, and one quarter carbs (no bigger than a computer mouse)   Cut down on sweet beverages. This includes juice, soda, and sweet tea.     Drink at least 1 glass of water with each meal and aim for at least 8 glasses per day   Exercise at least 150 minutes every week.

## 2020-06-01 ENCOUNTER — Ambulatory Visit: Payer: BC Managed Care – PPO | Admitting: Family Medicine

## 2020-06-03 ENCOUNTER — Encounter: Payer: Self-pay | Admitting: Family Medicine

## 2020-06-03 DIAGNOSIS — H00024 Hordeolum internum left upper eyelid: Secondary | ICD-10-CM | POA: Diagnosis not present

## 2020-06-10 DIAGNOSIS — H00024 Hordeolum internum left upper eyelid: Secondary | ICD-10-CM | POA: Diagnosis not present

## 2020-09-10 ENCOUNTER — Other Ambulatory Visit: Payer: Self-pay | Admitting: Family Medicine

## 2021-05-13 ENCOUNTER — Ambulatory Visit: Payer: 59 | Admitting: Family Medicine

## 2021-05-13 ENCOUNTER — Encounter: Payer: Self-pay | Admitting: Family Medicine

## 2021-05-13 VITALS — BP 130/76 | HR 59 | Temp 97.5°F | Ht 69.0 in | Wt 246.5 lb

## 2021-05-13 DIAGNOSIS — G8929 Other chronic pain: Secondary | ICD-10-CM | POA: Diagnosis not present

## 2021-05-13 DIAGNOSIS — M25512 Pain in left shoulder: Secondary | ICD-10-CM | POA: Diagnosis not present

## 2021-05-13 DIAGNOSIS — R1030 Lower abdominal pain, unspecified: Secondary | ICD-10-CM

## 2021-05-13 DIAGNOSIS — G43009 Migraine without aura, not intractable, without status migrainosus: Secondary | ICD-10-CM

## 2021-05-13 MED ORDER — SUMATRIPTAN SUCCINATE 100 MG PO TABS: 100.0000 mg | ORAL_TABLET | ORAL | 1 refills | Status: DC | PRN

## 2021-05-13 NOTE — Progress Notes (Signed)
? ?Subjective:  ? ? ? Patient ID: Vincent Hernandez, male    DOB: 12/16/1965, 56 y.o.   MRN: [id number] ? ?Chief Complaint  ?Patient presents with  ? Migraine  ?  Off and on since January 6, started Thursday, off and on ?Taking migraine meds ?  ? left shoulder pain  ?  On going isssue  ? Abdominal Pain  ?  Really bad cramps that started this morning  ? ? ?HPI ? Migraine since 1/6, intermitt-long h/o of it, but since hurt shoulder in Jan, worse.  Taking migraine meds.  Some nausea.  Currently 4-5/10. Pain in eyes. Neuro 1.5 yrs ago.-tried mult meds, but SE or insurance. Imitrex in past worked, then not and rizatriptan-wants to go back to imitrex. At least 1/wk.  No aura.  +photo and blurry vision.  No phono.  Occ nausea ?L shoulder pain-ongoing problem-sees ortho, but surg canc awaiting workman's comp approval.  Stressed about it.  Dull pain and worse. When gets migraine, pain in shoulder worse ?Abd pain/cramps starting this am. Ate expired honey butter from sept last pm.  About 130 this am woke up w/lower abd cramps-some constipation then loose.  About 330-loose again. No fever.  Had some chills.  Has had EGD/cscope in past.  Was taking [place] Negus colon health and stopped 1 wk ago. ?4.  Brother just dx prostate ca ? ?Health Maintenance Due  ?Topic Date Due  ? COVID-19 Vaccine (1) Never done  ? Hepatitis C Screening  Never done  ? TETANUS/TDAP  Never done  ? Zoster Vaccines- Shingrix (1 of 2) Never done  ? INFLUENZA VACCINE  Never done  ? ? ?Past Medical History:  ?Diagnosis Date  ? Arthritis   ? GERD (gastroesophageal reflux disease)   ? pt. denies  ? Herniated disc, cervical   ? through T1  ? Migraines   ? ? ?Past Surgical History:  ?Procedure Laterality Date  ? ANTERIOR CERVICAL DECOMP/DISCECTOMY FUSION N/A 09/01/2015  ? Procedure: ACDF C7-T1;  Surgeon: Melina Schools, MD;  Location: [place];  Service: Orthopedics;  Laterality: N/A;  ? BICEPS TENODESIS Right 06/19/2017  ? Dr. Theda Sers  ? CARPAL TUNNEL RELEASE Bilateral   ?  left 2016, right 2016  ? FOOT ARTHROPLASTY Right 2011  ? screws  ? GANGLION CYST EXCISION    ? MENISCUS REPAIR Left 2016  ? ORIF TOE FRACTURE Left 11/21/2012  ? Procedure: OPEN REDUCTION INTERNAL FIXATION (ORIF) LEFT FOURTH METATARSAL NONUNION FRACTURE;  Surgeon: Wylene Simmer, MD;  Location: [place];  Service: Orthopedics;  Laterality: Left;  ? REPAIR QUADRICEPS / HAMSTRING MUSCLE Right 2014  ? ROTATOR CUFF REPAIR Right 06/19/2017  ? Dr. Theda Sers  ? SEPTOPLASTY  1995  ? x 3 nose surgeries  ? SHOULDER ARTHROSCOPY W/ ROTATOR CUFF REPAIR Left 2013  ? TOTAL KNEE ARTHROPLASTY Right 05/04/2017  ? Procedure: RIGHT TOTAL KNEE ARTHROPLASTY;  Surgeon: Sydnee Cabal, MD;  Location: [facility];  Service: Orthopedics;  Laterality: Right;  Adductor Block  ? TOTAL KNEE ARTHROPLASTY Left 11/16/2017  ? Procedure: LEFT TOTAL KNEE ARTHROPLASTY;  Surgeon: Sydnee Cabal, MD;  Location: [facility];  Service: Orthopedics;  Laterality: Left;  ? ? ?Outpatient Medications Prior to Visit  ?Medication Sig Dispense Refill  ? cyclobenzaprine (FLEXERIL) 10 MG tablet TAKE ONE TABLET BY MOUTH THREE TIMES A DAY AS NEEDED FOR MUSCLE SPASMS 90 tablet 0  ? Echinacea 400 MG CAPS Take 800 mg by mouth daily.    ? Glucosamine-MSM-Hyaluronic Acd (JOINT HEALTH  PO) Take 2 tablets by mouth daily.    ? meloxicam (MOBIC) 7.5 MG tablet Take 7.5 mg by mouth daily.    ? Multiple Vitamin (MULTIVITAMIN WITH MINERALS) TABS tablet Take 1 tablet by mouth daily.    ? Omega-3 Fatty Acids (FISH OIL) 1000 MG CAPS Take 4,000 mg by mouth daily.    ? SUPER B COMPLEX/C CAPS Take 1 capsule by mouth daily.    ? vitamin C (ASCORBIC ACID) 500 MG tablet Take 500 mg by mouth daily.    ? rizatriptan (MAXALT) 10 MG tablet TAKE ONE TABLET BY MOUTH AS NEEDED FOR MIGRAINE. MAY REPEAT IN 2 HOURS IF NEEDED 10 tablet 3  ? bacitracin-polymyxin b (POLYSPORIN) ophthalmic ointment Place 1 application into the left eye every 12 (twelve) hours. apply to eye every 12 hours while awake 3.5 g  0  ? pyridOXINE (VITAMIN B-6) 100 MG tablet Take 100 mg by mouth daily.    ? ?No facility-administered medications prior to visit.  ? ? ?Allergies  ?Allergen Reactions  ? Robaxin [Methocarbamol] Itching  ? Other   ?  Other reaction(s): Other (See Comments) ?"Locked up" aches and pains  ? Tdap [Tetanus-Diphth-Acell Pertussis] Other (See Comments)  ?  "Locked up" aches and pains  ? ?ROS neg/noncontributory except as noted HPI/below ? ? ?   ?Objective:  ?  ? ?BP 130/76   Pulse (!) 59   Temp (!) 97.5 ?F (36.4 ?C) (Temporal)   Ht '5\' 9"'$  (1.753 m)   Wt 246 lb 8 oz (111.8 kg)   SpO2 99%   BMI 36.40 kg/m?  ?Wt Readings from Last 3 Encounters:  ?05/13/21 246 lb 8 oz (111.8 kg)  ?05/27/20 267 lb 12.8 oz (121.5 kg)  ?04/26/20 260 lb (117.9 kg)  ? ? ?Physical Exam  ? ?Gen: [facility] NAD OWM ?HEENT: NCAT, conjunctiva not injected, sclera nonicteric. EOMI ?NECK:  supple, no thyromegaly, no nodes, no carotid bruits ?CARDIAC: RRR, S1S2+, no murmur. DP 2+B ?LUNGS: CTAB. No wheezes ?ABDOMEN:  BS+, soft, NTND, No HSM, no masses. No CTAT ?EXT:  no edema ?MSK: no gross abnormalities.  ?NEURO: A&O x3.  CN II-XII intact.  ?PSYCH: normal mood. Good eye contact ? ?   ?Assessment & Plan:  ? ?Problem List Items Addressed This Visit   ? ?  ? Cardiovascular and Mediastinum  ? Migraine headache - Primary  ? Relevant Medications  ? meloxicam (MOBIC) 7.5 MG tablet  ? SUMAtriptan (IMITREX) 100 MG tablet  ? ?Other Visit Diagnoses   ? ? Lower abdominal pain      ? Chronic left shoulder pain      ? Relevant Medications  ? meloxicam (MOBIC) 7.5 MG tablet  ? ?  ? Migraine-will change maxalt back to imitrex.  Take Mg daily and cont B vitamins.  Eat small meals freq.  Stretches.  Keep log.  May need to see neuro again-f/u Dr. Jerline Pain ?Lower abd pain/loose stools today-?if from chronic constipation of which the colon health meds were helping and then he stopped, ?if from mild food poisoning-monitor, ?GE, other.  Take Mg daily for migraine wich may help this  as well.  Can get back on colon health meds ?Chronic L shoulder pain-on mobic.  Managed by ortho ?Bro just dx prostate ca.  Pt overdue for RHM/CPX.  Advised to sch w/Dr. Jerline Pain for cpx/labs/etc.  ? ?Meds ordered this encounter  ?Medications  ? SUMAtriptan (IMITREX) 100 MG tablet  ?  Sig: Take 1 tablet (100 mg total)  by mouth every 2 (two) hours as needed for migraine. May repeat in 2 hours if headache persists or recurs.  ?  Dispense:  30 tablet  ?  Refill:  1  ? ? ?Wellington Hampshire, MD ? ?

## 2021-05-13 NOTE — Patient Instructions (Addendum)
It was very nice to see you today! ? ?Magnesium 200-'250mg'$ /day. ?Change maxalt to imitrex.   ?Stretch neck.  Continue B vitamins ? ? ?PLEASE NOTE: ? ?If you had any lab tests please let us know if you have not heard back within a few days. You may see your results on MyChart before we have a chance to review them but we will give you a call once they are reviewed by [place]. If we ordered any referrals today, please let us know if you have not heard from their office within the next week.  ? ?Please try these tips to maintain a healthy lifestyle: ? ?Eat most of your calories during the day when you are active. Eliminate processed foods including packaged sweets (pies, cakes, cookies), reduce intake of potatoes, white bread, white pasta, and white rice. Look for whole grain options, oat flour or almond flour. ? ?Each meal should contain half fruits/vegetables, one quarter protein, and one quarter carbs (no bigger than a computer mouse). ? ?Cut down on sweet beverages. This includes juice, soda, and sweet tea. Also watch fruit intake, though this is a healthier sweet option, it still contains natural sugar! Limit to 3 servings daily. ? ?Drink at least 1 glass of water with each meal and aim for at least 8 glasses per day ? ?Exercise at least 150 minutes every week.   ?

## 2021-06-16 ENCOUNTER — Ambulatory Visit (INDEPENDENT_AMBULATORY_CARE_PROVIDER_SITE_OTHER): Payer: 59 | Admitting: Family Medicine

## 2021-06-16 ENCOUNTER — Encounter: Payer: Self-pay | Admitting: Family Medicine

## 2021-06-16 VITALS — BP 122/80 | HR 78 | Temp 98.7°F | Ht 69.0 in | Wt 247.8 lb

## 2021-06-16 DIAGNOSIS — Z8042 Family history of malignant neoplasm of prostate: Secondary | ICD-10-CM | POA: Diagnosis not present

## 2021-06-16 DIAGNOSIS — E785 Hyperlipidemia, unspecified: Secondary | ICD-10-CM | POA: Diagnosis not present

## 2021-06-16 DIAGNOSIS — G43009 Migraine without aura, not intractable, without status migrainosus: Secondary | ICD-10-CM | POA: Diagnosis not present

## 2021-06-16 DIAGNOSIS — Z0001 Encounter for general adult medical examination with abnormal findings: Secondary | ICD-10-CM

## 2021-06-16 DIAGNOSIS — R739 Hyperglycemia, unspecified: Secondary | ICD-10-CM | POA: Diagnosis not present

## 2021-06-16 MED ORDER — RIZATRIPTAN BENZOATE 10 MG PO TABS
10.0000 mg | ORAL_TABLET | ORAL | 0 refills | Status: DC | PRN
Start: 2021-06-16 — End: 2021-09-06

## 2021-06-16 NOTE — Assessment & Plan Note (Signed)
Check PSA. ?

## 2021-06-16 NOTE — Patient Instructions (Signed)
It was very nice to see you today! ? ?I will refill your Maxalt.  We will check blood work today.  Please continue to work on diet and exercise. ? ?I will see back in year for your next physical.  Come back sooner if needed. ? ?Take care, ?Dr Jerline Pain ? ?PLEASE NOTE: ? ?If you had any lab tests please let us know if you have not heard back within a few days. You may see your results on mychart before we have a chance to review them but we will give you a call once they are reviewed by [place]. If we ordered any referrals today, please let us know if you have not heard from their office within the next week.  ? ?Please try these tips to maintain a healthy lifestyle: ? ?Eat at least 3 REAL meals and 1-2 snacks per day.  Aim for no more than 5 hours between eating.  If you eat breakfast, please do so within one hour of getting up.  ? ?Each meal should contain half fruits/vegetables, one quarter protein, and one quarter carbs (no bigger than a computer mouse) ? ?Cut down on sweet beverages. This includes juice, soda, and sweet tea.  ? ?Drink at least 1 glass of water with each meal and aim for at least 8 glasses per day ? ?Exercise at least 150 minutes every week.   ? ?Preventive Care 87-24 Years Old, Male ?Preventive care refers to lifestyle choices and visits with your health care provider that can promote health and wellness. Preventive care visits are also called wellness exams. ?What can I expect for my preventive care visit? ?Counseling ?During your preventive care visit, your health care provider may ask about your: ?Medical history, including: ?Past medical problems. ?Family medical history. ?Current health, including: ?Emotional well-being. ?Home life and relationship well-being. ?Sexual activity. ?Lifestyle, including: ?Alcohol, nicotine or tobacco, and drug use. ?Access to firearms. ?Diet, exercise, and sleep habits. ?Safety issues such as seatbelt and bike helmet use. ?Sunscreen use. ?Work and work  Statistician. ?Physical exam ?Your health care provider will check your: ?Height and weight. These may be used to calculate your BMI (body mass index). BMI is a measurement that tells if you are at a healthy weight. ?Waist circumference. This measures the distance around your waistline. This measurement also tells if you are at a healthy weight and may help predict your risk of certain diseases, such as type 2 diabetes and high blood pressure. ?Heart rate and blood pressure. ?Body temperature. ?Skin for abnormal spots. ?What immunizations do I need? ? ?Vaccines are usually given at various ages, according to a schedule. Your health care provider will recommend vaccines for you based on your age, medical history, and lifestyle or other factors, such as travel or where you work. ?What tests do I need? ?Screening ?Your health care provider may recommend screening tests for certain conditions. This may include: ?Lipid and cholesterol levels. ?Diabetes screening. This is done by checking your blood sugar (glucose) after you have not eaten for a while (fasting). ?Hepatitis B test. ?Hepatitis C test. ?HIV (human immunodeficiency virus) test. ?STI (sexually transmitted infection) testing, if you are at risk. ?Lung cancer screening. ?Prostate cancer screening. ?Colorectal cancer screening. ?Talk with your health care provider about your test results, treatment options, and if necessary, the need for more tests. ?Follow these instructions at home: ?Eating and drinking ? ?Eat a diet that includes fresh fruits and vegetables, whole grains, lean protein, and low-fat dairy products. ?Take vitamin  and mineral supplements as recommended by your health care provider. ?Do not drink alcohol if your health care provider tells you not to drink. ?If you drink alcohol: ?Limit how much you have to 0-2 drinks a day. ?Know how much alcohol is in your drink. In the [place] one drink equals one 12 oz bottle of beer (355 mL), one 5 oz glass of  wine (148 mL), or one 1? oz glass of hard liquor (44 mL). ?Lifestyle ?Brush your teeth every morning and night with fluoride toothpaste. Floss one time each day. ?Exercise for at least 30 minutes 5 or more days each week. ?Do not use any products that contain nicotine or tobacco. These products include cigarettes, chewing tobacco, and vaping devices, such as e-cigarettes. If you need help quitting, ask your health care provider. ?Do not use drugs. ?If you are sexually active, practice safe sex. Use a condom or other form of protection to prevent STIs. ?Take aspirin only as told by your health care provider. Make sure that you understand how much to take and what form to take. Work with your health care provider to find out whether it is safe and beneficial for you to take aspirin daily. ?Find healthy ways to manage stress, such as: ?Meditation, yoga, or listening to music. ?Journaling. ?Talking to a trusted person. ?Spending time with friends and family. ?Minimize exposure to UV radiation to reduce your risk of skin cancer. ?Safety ?Always wear your seat belt while driving or riding in a vehicle. ?Do not drive: ?If you have been drinking alcohol. Do not ride with someone who has been drinking. ?When you are tired or distracted. ?While texting. ?If you have been using any mind-altering substances or drugs. ?Wear a helmet and other protective equipment during sports activities. ?If you have firearms in your house, make sure you follow all gun safety procedures. ?What's next? ?Go to your health care provider once a year for an annual wellness visit. ?Ask your health care provider how often you should have your eyes and teeth checked. ?Stay up to date on all vaccines. ?This information is not intended to replace advice given to you by your health care provider. Make sure you discuss any questions you have with your health care provider. ?Document Revised: 08/11/2020 Document Reviewed: 08/11/2020 ?Elsevier Patient  Education ? [place]. ? ?

## 2021-06-16 NOTE — Assessment & Plan Note (Signed)
We will refill Maxalt.  He uses a few times monthly as needed.  Worsened with stress. ?

## 2021-06-16 NOTE — Progress Notes (Signed)
? ?Chief Complaint:  ?Vincent Hernandez is a 56 y.o. male who presents today for his annual comprehensive physical exam.   ? ?Assessment/Plan:  ?New/Acute Problems: ?Rotator Cuff Tear ?Continue management per orthopedics. Will be having surgery soon.  ? ?Foot pain ?Secondary to degenerative changes.  Has known osteoarthritis.  We discussed referral to orthopedics or sports medicine however he deferred for now.  We discussed importance of good foot wear. ? ?Chronic Problems Addressed Today: ?Migraine headache ?We will refill Maxalt.  He uses a few times monthly as needed.  Worsened with stress. ? ?Family history of prostate cancer ?Check PSA. ? ?Dyslipidemia ?Check lipids.  Discussed lifestyle modifications. ? ? ? ?Preventative Healthcare: ?Up-to-date on colon cancer screening.  We will check labs today.  Vaccines declined. ? ?Patient Counseling(The following topics were reviewed and/or handout was given): ? -Nutrition: Stressed importance of moderation in sodium/caffeine intake, saturated fat and cholesterol, caloric balance, sufficient intake of fresh fruits, vegetables, and fiber. ? -Stressed the importance of regular exercise.  ? -Substance Abuse: Discussed cessation/primary prevention of tobacco, alcohol, or other drug use; driving or other dangerous activities under the influence; availability of treatment for abuse.  ? -Injury prevention: Discussed safety belts, safety helmets, smoke detector, smoking near bedding or upholstery.  ? -Sexuality: Discussed sexually transmitted diseases, partner selection, use of condoms, avoidance of unintended pregnancy and contraceptive alternatives.  ? -Dental health: Discussed importance of regular tooth brushing, flossing, and dental visits. ? -Health maintenance and immunizations reviewed. Please refer to Health maintenance section. ? ?Return to care in 1 year for next preventative visit.  ? ?  ?Subjective:  ?HPI: ? ?He has no acute complaints today. See A/p for status of  chronic conditions. Since our last visit he had an accident at work resulting in a rotator cuff tear. He has been following with orthopedics for this.  ? ?Is also been having issues with pain in his left foot.  This is been a longstanding issue.  He had trauma several years ago resulting in surgical fixation.  He also has a known history of arthritis in his foot. ? ?Lifestyle ?Diet: Balanced.  Cutting out sugar. ?Exercise: Active with work.  ? ? ?  06/16/2021  ?  2:19 PM  ?Depression screen PHQ 2/9  ?Decreased Interest 0  ?Down, Depressed, Hopeless 0  ?PHQ - 2 Score 0  ? ? ?Health Maintenance Due  ?Topic Date Due  ? Hepatitis C Screening  Never done  ?  ? ?ROS: Per HPI, otherwise a complete review of systems was negative.  ? ?PMH: ? ?The following were reviewed and entered/updated in epic: ?Past Medical History:  ?Diagnosis Date  ? Arthritis   ? GERD (gastroesophageal reflux disease)   ? pt. denies  ? Herniated disc, cervical   ? through T1  ? Migraines   ? ?Patient Active Problem List  ? Diagnosis Date Noted  ? Family history of prostate cancer 06/16/2021  ? Change in bowel habit 10/13/2018  ? Constipation 10/13/2018  ? Tinnitus 09/10/2017  ? Primary osteoarthritis of both knees 09/10/2017  ? Dyslipidemia 09/10/2017  ? S/P knee replacement 05/04/2017  ? Migraine headache 08/14/2015  ? Herniated disc, cervical 08/14/2015  ? ?Past Surgical History:  ?Procedure Laterality Date  ? ANTERIOR CERVICAL DECOMP/DISCECTOMY FUSION N/A 09/01/2015  ? Procedure: ACDF C7-T1;  Surgeon: Melina Schools, MD;  Location: [place];  Service: Orthopedics;  Laterality: N/A;  ? BICEPS TENODESIS Right 06/19/2017  ? Dr. Theda Sers  ? CARPAL TUNNEL RELEASE  Bilateral   ? left 2016, right 2016  ? FOOT ARTHROPLASTY Right 2011  ? screws  ? GANGLION CYST EXCISION    ? MENISCUS REPAIR Left 2016  ? ORIF TOE FRACTURE Left 11/21/2012  ? Procedure: OPEN REDUCTION INTERNAL FIXATION (ORIF) LEFT FOURTH METATARSAL NONUNION FRACTURE;  Surgeon: Wylene Simmer, MD;   Location: [place];  Service: Orthopedics;  Laterality: Left;  ? REPAIR QUADRICEPS / HAMSTRING MUSCLE Right 2014  ? ROTATOR CUFF REPAIR Right 06/19/2017  ? Dr. Theda Sers  ? SEPTOPLASTY  1995  ? x 3 nose surgeries  ? SHOULDER ARTHROSCOPY W/ ROTATOR CUFF REPAIR Left 2013  ? TOTAL KNEE ARTHROPLASTY Right 05/04/2017  ? Procedure: RIGHT TOTAL KNEE ARTHROPLASTY;  Surgeon: Sydnee Cabal, MD;  [facility];  Service: Orthopedics;  Laterality: Right;  Adductor Block  ? TOTAL KNEE ARTHROPLASTY Left 11/16/2017  ? Procedure: LEFT TOTAL KNEE ARTHROPLASTY;  Surgeon: Sydnee Cabal, MD;  [facility];  Service: Orthopedics;  Laterality: Left;  ? ? ?Family History  ?Problem Relation Age of Onset  ? Hypertension Mother   ? Kidney cancer Mother   ?     dialysis  ? Cancer Father 97  ?     colon  ? Stroke Father 45  ?     1973  ? Lung cancer Father   ? Colon cancer Father   ? Cancer Brother 65  ?     prostate ca  ? Diabetes Paternal Aunt   ? Diabetes Paternal Uncle   ? Diabetes Paternal Grandmother   ? Esophageal cancer Neg Hx   ? Inflammatory bowel disease Neg Hx   ? Liver disease Neg Hx   ? Pancreatic cancer Neg Hx   ? Stomach cancer Neg Hx   ? ? ?Medications- reviewed and updated ?Current Outpatient Medications  ?Medication Sig Dispense Refill  ? cyclobenzaprine (FLEXERIL) 10 MG tablet TAKE ONE TABLET BY MOUTH THREE TIMES A DAY AS NEEDED FOR MUSCLE SPASMS 90 tablet 0  ? Echinacea 400 MG CAPS Take 800 mg by mouth daily.    ? Glucosamine-MSM-Hyaluronic Acd (JOINT HEALTH PO) Take 2 tablets by mouth daily.    ? meloxicam (MOBIC) 7.5 MG tablet Take 7.5 mg by mouth daily.    ? Multiple Vitamin (MULTIVITAMIN WITH MINERALS) TABS tablet Take 1 tablet by mouth daily.    ? Omega-3 Fatty Acids (FISH OIL) 1000 MG CAPS Take 4,000 mg by mouth daily.    ? rizatriptan (MAXALT) 10 MG tablet Take 1 tablet (10 mg total) by mouth as needed for migraine. May repeat in 2 hours if needed 30 tablet 0  ? SUPER B COMPLEX/C CAPS Take 1  capsule by mouth daily.    ? vitamin C (ASCORBIC ACID) 500 MG tablet Take 500 mg by mouth daily.    ? ?No current facility-administered medications for this visit.  ? ? ?Allergies-reviewed and updated ?Allergies  ?Allergen Reactions  ? Robaxin [Methocarbamol] Itching  ? Other   ?  Other reaction(s): Other (See Comments) ?"Locked up" aches and pains  ? Tdap [Tetanus-Diphth-Acell Pertussis] Other (See Comments)  ?  "Locked up" aches and pains  ? ? ?Social History  ? ?Socioeconomic History  ? Marital status: Legally Separated  ?  Spouse name: Rodena Piety  ? Number of children: 3  ? Years of education: 70  ? Highest education level: Not on file  ?Occupational History  ? Occupation: Physiological scientist  ?  Comment: [employer]  ?Tobacco Use  ? Smoking status:  Former  ?  Types: Cigarettes  ? Smokeless tobacco: Never  ? Tobacco comments:  ?  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 Vaping Use: Never used  ?Substance and Sexual Activity  ? Alcohol use: Yes  ?  Alcohol/week: 0.0 standard drinks  ?  Comment: seldom  ? Drug use: No  ? Sexual activity: Yes  ?  Partners: Female  ?Other Topics Concern  ? Not on file  ?Social History Narrative  ? Lives with his wife and 4 dogs (3 [place] and a [place]).  ? Adult children live in [place] and [place].  ? ?Social Determinants of Health  ? ?Financial Resource Strain: Not on file  ?Food Insecurity: Not on file  ?Transportation Needs: Not on file  ?Physical Activity: Not on file  ?Stress: Not on file  ?Social Connections: Not on file  ? ?   ?  ?Objective:  ?Physical Exam: ?BP 122/80   Pulse 78   Temp 98.7 ?F (37.1 ?C) (Temporal)   Ht '5\' 9"'$  (1.753 m)   Wt 247 lb 12.8 oz (112.4 kg)   SpO2 97%   BMI 36.59 kg/m?   ?Body mass index is 36.59 kg/m?. ?Wt Readings from Last 3 Encounters:  ?06/16/21 247 lb 12.8 oz (112.4 kg)  ?05/13/21 246 lb 8 oz (111.8 kg)  ?05/27/20 267 lb 12.8 oz (121.5 kg)  ? ?Gen: NAD, resting comfortably ?HEENT: TMs normal bilaterally. OP clear.  No thyromegaly noted.  ?CV: RRR with no murmurs appreciated ?Pulm: NWOB, CTAB with no crackles, wheezes, or rhonchi ?GI: Normal bowel sounds present. Soft, Nontender, Nondistended. ?MSK: no edema, cyanosis, or clubbi

## 2021-06-16 NOTE — Assessment & Plan Note (Signed)
Check lipids. Discussed lifestyle modifications.  

## 2021-06-17 LAB — TSH: TSH: 1.51 u[IU]/mL (ref 0.35–5.50)

## 2021-06-17 LAB — LIPID PANEL
Cholesterol: 162 mg/dL (ref 0–200)
HDL: 44.6 mg/dL (ref >39.00)
LDL Cholesterol: 108 mg/dL — ABNORMAL HIGH (ref 0–99)
NonHDL: 117.27
Total CHOL/HDL Ratio: 4
Triglycerides: 44 mg/dL (ref 0.0–149.0)
VLDL: 8.8 mg/dL (ref 0.0–40.0)

## 2021-06-17 LAB — CBC
HCT: 42 % (ref 39.0–52.0)
Hemoglobin: 14.7 g/dL (ref 13.0–17.0)
MCHC: 34.9 g/dL (ref 30.0–36.0)
MCV: 93.6 fl (ref 78.0–100.0)
Platelets: 267 10*3/uL (ref 150.0–400.0)
RBC: 4.49 Mil/uL (ref 4.22–5.81)
RDW: 13.3 % (ref 11.5–15.5)
WBC: 8.6 10*3/uL (ref 4.0–10.5)

## 2021-06-17 LAB — COMPREHENSIVE METABOLIC PANEL
ALT: 27 U/L (ref 0–53)
AST: 26 U/L (ref 0–37)
Albumin: 4.3 g/dL (ref 3.5–5.2)
Alkaline Phosphatase: 72 U/L (ref 39–117)
BUN: 19 mg/dL (ref 6–23)
CO2: 26 mEq/L (ref 19–32)
Calcium: 9.2 mg/dL (ref 8.4–10.5)
Chloride: 103 mEq/L (ref 96–112)
Creatinine, Ser: 0.75 mg/dL (ref 0.40–1.50)
GFR: 100.45 mL/min (ref >60.00)
Glucose, Bld: 79 mg/dL (ref 70–99)
Potassium: 3.9 mEq/L (ref 3.5–5.1)
Sodium: 139 mEq/L (ref 135–145)
Total Bilirubin: 0.8 mg/dL (ref 0.2–1.2)
Total Protein: 7 g/dL (ref 6.0–8.3)

## 2021-06-17 LAB — PSA: PSA: 5.37 ng/mL — ABNORMAL HIGH (ref 0.10–4.00)

## 2021-06-17 LAB — HEMOGLOBIN A1C: Hgb A1c MFr Bld: 5.2 % (ref 4.6–6.5)

## 2021-06-20 NOTE — Progress Notes (Signed)
Please inform patient of the following: ? ?His PSA is borderline elevated.  With his family history recommend referral to urology for further evaluation.  Please place order. ? ?His cholesterol is little elevated but stable compared to previous values.  Everything else is normal do not need to make any other changes to treatment plan at this time we can recheck in a year.  He should continue to work on diet and exercise.

## 2021-07-21 ENCOUNTER — Other Ambulatory Visit: Payer: Self-pay

## 2021-07-21 ENCOUNTER — Telehealth: Payer: Self-pay | Admitting: Family Medicine

## 2021-07-21 DIAGNOSIS — R972 Elevated prostate specific antigen [PSA]: Secondary | ICD-10-CM

## 2021-07-21 NOTE — Telephone Encounter (Signed)
..  Reason for Referral Request:  To check for prostate cancer  Has Patient been seen by PCP for this complaint?  No, Please schedule patient for appointment for complaint.  Yes, Please find out following information:  Reason:   Referral to which Specialty: Urology  Preferred office/ provider:  [facility], Dr. Rexene Alberts

## 2021-07-21 NOTE — Telephone Encounter (Signed)
Please place referral to urology and refer to the last results note.  Algis Greenhouse. Jerline Pain, MD 07/21/2021 12:11 PM

## 2021-07-21 NOTE — Telephone Encounter (Signed)
Please advise 

## 2021-07-21 NOTE — Telephone Encounter (Signed)
Referral placed to [facility] to see Rexene Alberts at [facility] per pt preference

## 2021-09-06 ENCOUNTER — Ambulatory Visit: Payer: 59 | Admitting: Family Medicine

## 2021-09-06 ENCOUNTER — Encounter: Payer: Self-pay | Admitting: Family Medicine

## 2021-09-06 VITALS — BP 102/70 | HR 78 | Temp 97.9°F | Ht 69.0 in | Wt 254.2 lb

## 2021-09-06 DIAGNOSIS — M25512 Pain in left shoulder: Secondary | ICD-10-CM

## 2021-09-06 DIAGNOSIS — M25519 Pain in unspecified shoulder: Secondary | ICD-10-CM | POA: Insufficient documentation

## 2021-09-06 DIAGNOSIS — Z8042 Family history of malignant neoplasm of prostate: Secondary | ICD-10-CM

## 2021-09-06 DIAGNOSIS — R972 Elevated prostate specific antigen [PSA]: Secondary | ICD-10-CM | POA: Diagnosis not present

## 2021-09-06 DIAGNOSIS — G43009 Migraine without aura, not intractable, without status migrainosus: Secondary | ICD-10-CM | POA: Diagnosis not present

## 2021-09-06 DIAGNOSIS — Z1159 Encounter for screening for other viral diseases: Secondary | ICD-10-CM

## 2021-09-06 LAB — PSA: PSA: 4.25 ng/mL — ABNORMAL HIGH (ref 0.10–4.00)

## 2021-09-06 MED ORDER — CYCLOBENZAPRINE HCL 10 MG PO TABS: ORAL_TABLET | ORAL | 0 refills | Status: AC

## 2021-09-06 MED ORDER — RIZATRIPTAN BENZOATE 10 MG PO TABS: 10.0000 mg | ORAL_TABLET | ORAL | 3 refills | Status: DC | PRN

## 2021-09-06 NOTE — Patient Instructions (Signed)
It was very nice to see you today!  We will refill your medications today.  We will check blood work.  We may need to send you to a urologist depending on the results.  Take care, Dr Jerline Pain  PLEASE NOTE:  If you had any lab tests please let us know if you have not heard back within a few days. You may see your results on mychart before we have a chance to review them but we will give you a call once they are reviewed by [place]. If we ordered any referrals today, please let us know if you have not heard from their office within the next week.   Please try these tips to maintain a healthy lifestyle:  Eat at least 3 REAL meals and 1-2 snacks per day.  Aim for no more than 5 hours between eating.  If you eat breakfast, please do so within one hour of getting up.   Each meal should contain half fruits/vegetables, one quarter protein, and one quarter carbs (no bigger than a computer mouse)  Cut down on sweet beverages. This includes juice, soda, and sweet tea.   Drink at least 1 glass of water with each meal and aim for at least 8 glasses per day  Exercise at least 150 minutes every week.

## 2021-09-06 NOTE — Assessment & Plan Note (Signed)
Continue management per orthopedics. 

## 2021-09-06 NOTE — Progress Notes (Signed)
   Vincent Hernandez is a 56 y.o. male who presents today for an office visit.  Assessment/Plan:  Chronic Problems Addressed Today: Migraine headache Maxalt refilled.  Discussed preventative medications however deferred for now.  He will let us know if this continues to be an issue and we can discuss at that time.  Left shoulder pain Continue management per orthopedics.  Elevated PSA We will recheck PSA.  Will need urology evaluation if persistently elevated.     Subjective:  HPI:  See A/P for status of chronic conditions.  He was seen 3 months ago for annual physical.  Found to have slightly elevated PSA.  He was referred to urology however has not followed up with them.  He is here today to have his PSA rechecked. Is also been having issues with left rotator cuff tear.  This happened several months ago.  Has been following with orthopedics.  They attempted repair however this was not completed.  He had worsening symptoms recently and they are concerned about further tears.  He has an upcoming MRI planned.  Migraines have been worse and he needs refill on Maxalt today.       Objective:  Physical Exam: BP 102/70   Pulse 78   Temp 97.9 F (36.6 C) (Temporal)   Ht '5\' 9"'$  (1.753 m)   Wt 254 lb 3.2 oz (115.3 kg)   SpO2 96%   BMI 37.54 kg/m   Gen: No acute distress, resting comfortably Neuro: Grossly normal, moves all extremities Psych: Normal affect and thought content      Kenzy Campoverde M. Jerline Pain, MD 09/06/2021 11:48 AM

## 2021-09-06 NOTE — Assessment & Plan Note (Signed)
We will recheck PSA.  Will need urology evaluation if persistently elevated.

## 2021-09-06 NOTE — Assessment & Plan Note (Signed)
Maxalt refilled.  Discussed preventative medications however deferred for now.  He will let us know if this continues to be an issue and we can discuss at that time.

## 2021-09-07 LAB — HEPATITIS C ANTIBODY: Hepatitis C Ab: NONREACTIVE

## 2021-09-09 NOTE — Progress Notes (Signed)
Please inform patient of the following:  His PSA is trending down.  We can recheck again in 6 months.

## 2021-10-25 ENCOUNTER — Ambulatory Visit: Payer: 59 | Admitting: Family Medicine

## 2021-10-25 ENCOUNTER — Encounter: Payer: Self-pay | Admitting: Family Medicine

## 2021-10-25 VITALS — BP 112/72 | HR 75 | Temp 97.9°F | Ht 69.0 in | Wt 251.2 lb

## 2021-10-25 DIAGNOSIS — N529 Male erectile dysfunction, unspecified: Secondary | ICD-10-CM | POA: Diagnosis not present

## 2021-10-25 DIAGNOSIS — N400 Enlarged prostate without lower urinary tract symptoms: Secondary | ICD-10-CM

## 2021-10-25 DIAGNOSIS — M25519 Pain in unspecified shoulder: Secondary | ICD-10-CM | POA: Diagnosis not present

## 2021-10-25 LAB — PSA: PSA: 5.2 ng/mL — ABNORMAL HIGH (ref 0.10–4.00)

## 2021-10-25 MED ORDER — TADALAFIL 10 MG PO TABS: 5.0000 mg | ORAL_TABLET | Freq: Every day | ORAL | 1 refills | Status: DC | PRN

## 2021-10-25 NOTE — Patient Instructions (Addendum)
It was very nice to see you today!  Please try the cialis.  Let me know in a few weeks how this is working for you.  We will check your PSA today.  I will see back in April for your annual checkup.  Please come back to see me sooner if needed.  Take care, Dr Jerline Pain  PLEASE NOTE:  If you had any lab tests please let us know if you have not heard back within a few days. You may see your results on mychart before we have a chance to review them but we will give you a call once they are reviewed by [place]. If we ordered any referrals today, please let us know if you have not heard from their office within the next week.   Please try these tips to maintain a healthy lifestyle:  Eat at least 3 REAL meals and 1-2 snacks per day.  Aim for no more than 5 hours between eating.  If you eat breakfast, please do so within one hour of getting up.   Each meal should contain half fruits/vegetables, one quarter protein, and one quarter carbs (no bigger than a computer mouse)  Cut down on sweet beverages. This includes juice, soda, and sweet tea.   Drink at least 1 glass of water with each meal and aim for at least 8 glasses per day  Exercise at least 150 minutes every week.

## 2021-10-25 NOTE — Assessment & Plan Note (Signed)
Continue management per orthopedics. 

## 2021-10-25 NOTE — Assessment & Plan Note (Signed)
Has had issues in the past but is now managing well without any significant LUTS.  With starting Cialis as above which should help treat any underlying BPH issues.

## 2021-10-25 NOTE — Progress Notes (Signed)
   TEREZ FREIMARK is a 56 y.o. male who presents today for an office visit.  Assessment/Plan:  Chronic Problems Addressed Today: Erectile dysfunction Worse recently.  Symptoms not controlled.  Is never been on any medications in the past.  We discussed treatment options.  We will try Cialis 5 to 10 mg daily as needed.  We discussed potential side effects.  He will follow-up in a few weeks via MyChart and we can titrate the dose as needed.  May consider trial of Viagra if he does not respond well to Cialis.  BPH (benign prostatic hyperplasia) Has had issues in the past but is now managing well without any significant LUTS.  With starting Cialis as above which should help treat any underlying BPH issues.  Shoulder pain Continue management per orthopedics.     Subjective:  HPI:  See A/p for status of chronic conditions.         Objective:  Physical Exam: BP 112/72   Pulse 75   Temp 97.9 F (36.6 C) (Temporal)   Ht '5\' 9"'$  (1.753 m)   Wt 251 lb 3.2 oz (113.9 kg)   SpO2 96%   BMI 37.10 kg/m   Gen: No acute distress, resting comfortably CV: Regular rate and rhythm with no murmurs appreciated Pulm: Normal work of breathing, clear to auscultation bilaterally with no crackles, wheezes, or rhonchi Neuro: Grossly normal, moves all extremities Psych: Normal affect and thought content      Kashis Penley M. Jerline Pain, MD 10/25/2021 11:30 AM

## 2021-10-25 NOTE — Assessment & Plan Note (Signed)
Worse recently.  Symptoms not controlled.  Is never been on any medications in the past.  We discussed treatment options.  We will try Cialis 5 to 10 mg daily as needed.  We discussed potential side effects.  He will follow-up in a few weeks via MyChart and we can titrate the dose as needed.  May consider trial of Viagra if he does not respond well to Cialis.

## 2021-10-26 NOTE — Progress Notes (Signed)
Please inform patient of the following:  His PSA is up a little bit though still better than a few months ago.  We can recheck again in 3 to 6 months.

## 2021-12-27 ENCOUNTER — Encounter: Payer: Self-pay | Admitting: Family Medicine

## 2021-12-28 ENCOUNTER — Other Ambulatory Visit: Payer: Self-pay

## 2021-12-28 DIAGNOSIS — N529 Male erectile dysfunction, unspecified: Secondary | ICD-10-CM

## 2021-12-28 MED ORDER — TADALAFIL 10 MG PO TABS: 5.0000 mg | ORAL_TABLET | Freq: Every day | ORAL | 1 refills | Status: DC | PRN

## 2021-12-29 NOTE — Telephone Encounter (Signed)
Ok to refill.  Algis Greenhouse. Jerline Pain, MD 12/29/2021 5:02 PM

## 2021-12-30 NOTE — Telephone Encounter (Signed)
Rx send on 12/28/2021

## 2021-12-31 ENCOUNTER — Other Ambulatory Visit: Payer: Self-pay | Admitting: Family Medicine

## 2021-12-31 DIAGNOSIS — N529 Male erectile dysfunction, unspecified: Secondary | ICD-10-CM

## 2022-01-04 ENCOUNTER — Other Ambulatory Visit: Payer: Self-pay | Admitting: *Deleted

## 2022-01-05 ENCOUNTER — Other Ambulatory Visit: Payer: Self-pay | Admitting: *Deleted

## 2022-01-05 ENCOUNTER — Other Ambulatory Visit: Payer: Self-pay | Admitting: Family Medicine

## 2022-01-05 DIAGNOSIS — N529 Male erectile dysfunction, unspecified: Secondary | ICD-10-CM

## 2022-01-05 MED ORDER — TADALAFIL 10 MG PO TABS: 5.0000 mg | ORAL_TABLET | Freq: Every day | ORAL | 1 refills | Status: DC | PRN

## 2022-03-27 ENCOUNTER — Encounter: Payer: Self-pay | Admitting: Family Medicine

## 2022-03-27 ENCOUNTER — Other Ambulatory Visit: Payer: Self-pay | Admitting: Family Medicine

## 2022-03-27 DIAGNOSIS — N529 Male erectile dysfunction, unspecified: Secondary | ICD-10-CM

## 2022-03-27 NOTE — Telephone Encounter (Signed)
We can do lab only visit.  Algis Greenhouse. Jerline Pain, MD 03/27/2022 1:34 PM

## 2022-03-27 NOTE — Telephone Encounter (Signed)
Rx order send to [facility]  Does patient need OV of Lab visit?

## 2022-03-31 ENCOUNTER — Other Ambulatory Visit: Payer: Self-pay | Admitting: *Deleted

## 2022-03-31 DIAGNOSIS — R972 Elevated prostate specific antigen [PSA]: Secondary | ICD-10-CM

## 2022-04-28 ENCOUNTER — Encounter: Payer: Self-pay | Admitting: Family Medicine

## 2022-04-28 ENCOUNTER — Ambulatory Visit (INDEPENDENT_AMBULATORY_CARE_PROVIDER_SITE_OTHER): Payer: Self-pay | Admitting: Family Medicine

## 2022-04-28 VITALS — BP 106/71 | HR 66 | Temp 97.7°F | Ht 69.0 in | Wt 262.4 lb

## 2022-04-28 DIAGNOSIS — N529 Male erectile dysfunction, unspecified: Secondary | ICD-10-CM

## 2022-04-28 DIAGNOSIS — N400 Enlarged prostate without lower urinary tract symptoms: Secondary | ICD-10-CM

## 2022-04-28 DIAGNOSIS — R972 Elevated prostate specific antigen [PSA]: Secondary | ICD-10-CM

## 2022-04-28 DIAGNOSIS — G43009 Migraine without aura, not intractable, without status migrainosus: Secondary | ICD-10-CM

## 2022-04-28 LAB — PSA: PSA: 5.15 ng/mL — ABNORMAL HIGH (ref 0.10–4.00)

## 2022-04-28 MED ORDER — TADALAFIL 10 MG PO TABS: 10.0000 mg | ORAL_TABLET | Freq: Every day | ORAL | 3 refills | Status: DC | PRN

## 2022-04-28 MED ORDER — RIZATRIPTAN BENZOATE 10 MG PO TABS: 10.0000 mg | ORAL_TABLET | ORAL | 3 refills | Status: AC | PRN

## 2022-04-28 NOTE — Patient Instructions (Signed)
It was very nice to see you today!  We will check your PSA today.  I will refill your medications.  Will recheck in 6 to 12 months.  Take care, Dr Jerline Pain  PLEASE NOTE:  If you had any lab tests, please let us know if you have not heard back within a few days. You may see your results on mychart before we have a chance to review them but we will give you a call once they are reviewed by [place].   If we ordered any referrals today, please let us know if you have not heard from their office within the next week.   If you had any urgent prescriptions sent in today, please check with the pharmacy within an hour of our visit to make sure the prescription was transmitted appropriately.   Please try these tips to maintain a healthy lifestyle:  Eat at least 3 REAL meals and 1-2 snacks per day.  Aim for no more than 5 hours between eating.  If you eat breakfast, please do so within one hour of getting up.   Each meal should contain half fruits/vegetables, one quarter protein, and one quarter carbs (no bigger than a computer mouse)  Cut down on sweet beverages. This includes juice, soda, and sweet tea.   Drink at least 1 glass of water with each meal and aim for at least 8 glasses per day  Exercise at least 150 minutes every week.

## 2022-04-28 NOTE — Progress Notes (Signed)
   Vincent Hernandez is a 57 y.o. male who presents today for an office visit.  Assessment/Plan:  New/Acute Problems: Hand Pain No red flags.  Likely has some underlying osteoarthritis.  He can use over-the-counter meds as needed.  He does have tender nodule but has no signs of trigger finger we will continue with watchful waiting for now.  Will have him follow back up with orthopedics if this continues to be an issue.  Chronic Problems Addressed Today: Migraine headache Stable on Maxalt as needed.  Will refill today.  Elevated PSA Recheck PSA.  If increasing will need to be referred to urology though levels of been stable over last year or so.  Erectile dysfunction Stable on Cialis 10 mg daily.  Doing well.  No significant side effects.  BPH (benign prostatic hyperplasia) Doing better with Cialis 2 mg daily.  Will refill today.     Subjective:  HPI:  See A/p for status of chronic conditions.  He needs refill on medications today.  We last saw him about 6 months ago.  PSA was slightly elevated and he is here to have this rechecked today.  He has been taking his Cialis almost every day.  This does seem to help with his BPH symptoms.  Is also been having occasional issues with hand pain.  Seems to be worsening first and that this does come and go.       Objective:  Physical Exam: BP 106/71   Pulse 66   Temp 97.7 F (36.5 C) (Temporal)   Ht '5\' 9"'$  (1.753 m)   Wt 262 lb 6.4 oz (119 kg)   SpO2 96%   BMI 38.75 kg/m   Gen: No acute distress, resting comfortably CV: Regular rate and rhythm with no murmurs appreciated Pulm: Normal work of breathing, clear to auscultation bilaterally with no crackles, wheezes, or rhonchi MSK: - Hand: Joint hypertrophy noted.  Tender nodule noted along the flexor tendon of left hand.  Neuro: Grossly normal, moves all extremities Psych: Normal affect and thought content      Sigurd Pugh M. Jerline Pain, MD 04/28/2022 1:36 PM

## 2022-04-28 NOTE — Assessment & Plan Note (Signed)
Stable on Maxalt as needed.  Will refill today.

## 2022-04-28 NOTE — Assessment & Plan Note (Signed)
Recheck PSA.  If increasing will need to be referred to urology though levels of been stable over last year or so.

## 2022-04-28 NOTE — Assessment & Plan Note (Signed)
Stable on Cialis 10 mg daily.  Doing well.  No significant side effects.

## 2022-04-28 NOTE — Assessment & Plan Note (Signed)
Doing better with Cialis 2 mg daily.  Will refill today.

## 2022-05-01 ENCOUNTER — Other Ambulatory Visit: Payer: Self-pay | Admitting: *Deleted

## 2022-05-01 DIAGNOSIS — R972 Elevated prostate specific antigen [PSA]: Secondary | ICD-10-CM

## 2022-05-01 NOTE — Progress Notes (Signed)
Please inform patient of the following:  His PSA is stable at 5.15.  This is a little bit better than the last time we checked 6 months ago.  At some point we should have him see a urologist however we can check again in about 6 months.  Please place referral to urology if he is agreeable.

## 2022-08-14 ENCOUNTER — Encounter: Payer: Self-pay | Admitting: Family

## 2022-08-14 ENCOUNTER — Ambulatory Visit (INDEPENDENT_AMBULATORY_CARE_PROVIDER_SITE_OTHER): Payer: Self-pay | Admitting: Family

## 2022-08-14 VITALS — BP 118/76 | HR 68 | Temp 98.2°F | Ht 69.0 in | Wt 260.2 lb

## 2022-08-14 DIAGNOSIS — R1310 Dysphagia, unspecified: Secondary | ICD-10-CM

## 2022-08-14 NOTE — Progress Notes (Signed)
Patient ID: Vincent Hernandez, male    DOB: 03/20/1965, 57 y.o.   MRN: [id number]  Chief Complaint  Patient presents with   Choking    Pt c/o throat pain since choking on a steak yesterday. Pt is unable to eat or drink, comes right back up. Pain comes and goes.     HPI:      Sore throat:  reports eating steak at home last night and started choking but was able to cough it up. But reports since then he has had a scratchy, painful throat and whenever he swallows any liquid or ice cream he tried it comes back up and he vomits. Reports feeling like something is stuck at the base of his throat. He states he used Chloraseptic spray to try and help his throat pain, and he has been coughing/spitting up light pink tinged phlegm since the incident. . States same episode has happened in the past where he choked on a piece of steak and he did not seek ER or have assessed at that time. Also reports having had ENDO/COLONoscopy procedures done for stomach/bowel issues back in 2020 and were normal.  Denies any heartburn or reflux sx.   Assessment & Plan:  1. Dysphagia, unspecified type - pt denies breathing issues, able to swallow, just difficult and with pain. Advised this is most likely an acute issue d/t gagging & coughing to get food back up. Sx hopefully will resolve in the next few days. Advised pt take his Flexeril 5-10mg  and see if any relief today and tomorrow. RTO precautions & when to seek ER provided. Will send referral to ENT as pt reports same incident happening in past.  - Ambulatory referral to ENT   Subjective:    Outpatient Medications Prior to Visit  Medication Sig Dispense Refill   acetaminophen (TYLENOL) 650 MG CR tablet Take by mouth.     cyclobenzaprine (FLEXERIL) 10 MG tablet TAKE ONE TABLET BY MOUTH THREE TIMES A DAY AS NEEDED FOR MUSCLE SPASMS 90 tablet 0   Echinacea 400 MG CAPS Take 800 mg by mouth daily.     Glucosamine-MSM-Hyaluronic Acd (JOINT HEALTH PO) Take 2 tablets by mouth  daily.     meloxicam (MOBIC) 7.5 MG tablet Take 7.5 mg by mouth daily.     Multiple Vitamin (MULTIVITAMIN WITH MINERALS) TABS tablet Take 1 tablet by mouth daily.     Omega-3 Fatty Acids (FISH OIL) 1000 MG CAPS Take 4,000 mg by mouth daily.     rizatriptan (MAXALT) 10 MG tablet Take 1 tablet (10 mg total) by mouth as needed for migraine. May repeat in 2 hours if needed 30 tablet 3   SUPER B COMPLEX/C CAPS Take 1 capsule by mouth daily.     tadalafil (CIALIS) 10 MG tablet Take 1 tablet (10 mg total) by mouth daily as needed for erectile dysfunction. 90 tablet 3   vitamin C (ASCORBIC ACID) 500 MG tablet Take 500 mg by mouth daily.     No facility-administered medications prior to visit.   Past Medical History:  Diagnosis Date   Arthritis    GERD (gastroesophageal reflux disease)    pt. denies   Herniated disc, cervical    through T1   Migraines    Past Surgical History:  Procedure Laterality Date   ANTERIOR CERVICAL DECOMP/DISCECTOMY FUSION N/A 09/01/2015   Procedure: ACDF C7-T1;  Surgeon: Venita Lick, MD;  Location: [facility];  Service: Orthopedics;  Laterality: N/A;   BICEPS TENODESIS Right 06/19/2017  Dr. Thomasena Edis   CARPAL TUNNEL RELEASE Bilateral    left 2016, right 2016   FOOT ARTHROPLASTY Right 2011   screws   GANGLION CYST EXCISION     MENISCUS REPAIR Left 2016   ORIF TOE FRACTURE Left 11/21/2012   Procedure: OPEN REDUCTION INTERNAL FIXATION (ORIF) LEFT FOURTH METATARSAL NONUNION FRACTURE;  Surgeon: Toni Arthurs, MD;  Location: [facility];  Service: Orthopedics;  Laterality: Left;   REPAIR QUADRICEPS / HAMSTRING MUSCLE Right 2014   ROTATOR CUFF REPAIR Right 06/19/2017   Dr. Thomasena Edis   SEPTOPLASTY  1995   x 3 nose surgeries   SHOULDER ARTHROSCOPY W/ ROTATOR CUFF REPAIR Left 2013   TOTAL KNEE ARTHROPLASTY Right 05/04/2017   Procedure: RIGHT TOTAL KNEE ARTHROPLASTY;  Surgeon: Eugenia Mcalpine, MD;  Location: [facility];  Service: Orthopedics;  Laterality: Right;  Adductor  Block   TOTAL KNEE ARTHROPLASTY Left 11/16/2017   Procedure: LEFT TOTAL KNEE ARTHROPLASTY;  Surgeon: Eugenia Mcalpine, MD;  Location: [facility];  Service: Orthopedics;  Laterality: Left;   Allergies  Allergen Reactions   Robaxin [Methocarbamol] Itching   Other     Other reaction(s): Other (See Comments) "Locked up" aches and pains   Tdap [Tetanus-Diphth-Acell Pertussis] Other (See Comments)    "Locked up" aches and pains   Tetanus Toxoid Swelling    Muscles locked up, became stiff from vaccine      Objective:    Physical Exam Vitals and nursing note reviewed.  Constitutional:      General: He is not in acute distress.    Appearance: Normal appearance.  HENT:     Head: Normocephalic.     Mouth/Throat:     Mouth: Mucous membranes are moist.     Pharynx: Posterior oropharyngeal erythema present. No pharyngeal swelling, oropharyngeal exudate or uvula swelling.     Tonsils: No tonsillar exudate or tonsillar abscesses.  Cardiovascular:     Rate and Rhythm: Normal rate and regular rhythm.  Pulmonary:     Effort: Pulmonary effort is normal.     Breath sounds: Normal breath sounds.  Musculoskeletal:        General: Normal range of motion.     Cervical back: Normal range of motion.  Lymphadenopathy:     Head:     Right side of head: No tonsillar, preauricular or posterior auricular adenopathy.     Left side of head: No tonsillar, preauricular or posterior auricular adenopathy.     Cervical: No cervical adenopathy.     Upper Body:     Right upper body: No supraclavicular adenopathy.     Left upper body: No supraclavicular adenopathy.  Skin:    General: Skin is warm and dry.  Neurological:     Mental Status: He is alert and oriented to person, place, and time.  Psychiatric:        Mood and Affect: Mood normal.    BP 118/76   Pulse 68   Temp 98.2 F (36.8 C) (Temporal)   Ht 5\' 9"  (1.753 m)   Wt 260 lb 4 oz (118 kg)   SpO2 96%   BMI 38.43 kg/m  Wt Readings from Last 3  Encounters:  08/14/22 260 lb 4 oz (118 kg)  04/28/22 262 lb 6.4 oz (119 kg)  10/25/21 251 lb 3.2 oz (113.9 kg)      Dulce Sellar, NP

## 2022-09-11 ENCOUNTER — Ambulatory Visit (INDEPENDENT_AMBULATORY_CARE_PROVIDER_SITE_OTHER): Payer: Self-pay | Admitting: Family Medicine

## 2022-09-11 ENCOUNTER — Encounter: Payer: Self-pay | Admitting: Family Medicine

## 2022-09-11 VITALS — BP 111/72 | HR 69 | Temp 97.2°F | Ht 69.0 in | Wt 266.8 lb

## 2022-09-11 DIAGNOSIS — R972 Elevated prostate specific antigen [PSA]: Secondary | ICD-10-CM

## 2022-09-11 DIAGNOSIS — M1990 Unspecified osteoarthritis, unspecified site: Secondary | ICD-10-CM

## 2022-09-11 LAB — PSA: PSA: 5.27 ng/mL — ABNORMAL HIGH (ref 0.10–4.00)

## 2022-09-11 MED ORDER — HYDROCODONE-ACETAMINOPHEN 5-325 MG PO TABS: 1.0000 | ORAL_TABLET | Freq: Four times a day (QID) | ORAL | 0 refills | Status: AC | PRN

## 2022-09-11 NOTE — Progress Notes (Signed)
His PSA is stable compared to previous values.  Overall lower than it was a year ago but slightly up compared to last time.  We can recheck again in 6 months or refer to urology if he prefers that.

## 2022-09-11 NOTE — Assessment & Plan Note (Signed)
Check PSA.  If increasing needs to go to urology.  We discussed this today in depth.

## 2022-09-11 NOTE — Progress Notes (Signed)
   Vincent Hernandez is a 57 y.o. male who presents today for an office visit.  Assessment/Plan:  Chronic Problems Addressed Today: Osteoarthritis I had a lengthy discussion with patient today regarding his joint pain and symptom management.  He is in a difficult situation as he has end-stage arthritis that needs joint replacement however he does not currently have insurance.  He is working to get on disability and potentially Medicare however this may take several more months before he can get on this.  He has several nonopioid pain medications that he uses routinely to help manage pain.    He does have hydrocodone to use as needed for severe flare ups.  Uses this very sparingly as needed.  Last refill was over a year ago.  He tolerates reasonably well with no significant side effects.  His database was reviewed without red flags.  We will give small refill today.  Discussed with patient that I cannot give this on a chronic or recurring basis.  He voiced understanding.  We did discuss referral to sports medicine however he declined stating that he knows that he needs a joint replacement and he will get this once it is financially viable for him.   Handicap placard form filled out today.  Elevated PSA Check PSA.  If increasing needs to go to urology.  We discussed this today in depth.     Subjective:  HPI:  See Assessment / plan for status of chronic conditions.  His main concern today his joint pain. Marland Kitchen He does have a history osteoarthritis and orthopedics has recommend joint replacement however he is not yet ready to commit to this. He has been out of work since last year and is currently self pay.  He is not able to afford getting joint replacement surgery at this point. he is going through the process of disability to get on insurance. He did look into getting into medicaid however he did not qualify for this.  He has been managing his pain usually with over-the-counter medications.  Does have  meloxicam to take as needed as well.  He has some leftover hydrocodone from a surgery a couple years ago that he has been taking as needed for acute flares of pain.  He needs a refill on this today.  Uses this very sparingly as needed.  He sometimes will get constipated if he takes too much of this however this has not been an issue since he is spacing out the doses.  His last refill on this was over a year ago and he has made prescription last since then.  He would like for [place] to refill this for him today.  He will continue seeing orthopedics for his left shoulder pain related to work accident.  This is covered under Worker's Comp. however none of his other joint pain is covered under this.       Objective:  Physical Exam: BP 111/72   Pulse 69   Temp (!) 97.2 F (36.2 C) (Temporal)   Ht 5\' 9"  (1.753 m)   Wt 266 lb 12.8 oz (121 kg)   SpO2 96%   BMI 39.40 kg/m   Gen: No acute distress, resting comfortably Neuro: Grossly normal, moves all extremities Psych: Normal affect and thought content      Bronco Mcgrory M. Jimmey Ralph, MD 09/11/2022 8:41 AM

## 2022-09-11 NOTE — Patient Instructions (Addendum)
It was very nice to see you today!  We will refill your medications today.  We will check PSA today.   Take care, Dr Jimmey Ralph  PLEASE NOTE:  If you had any lab tests, please let us know if you have not heard back within a few days. You may see your results on mychart before we have a chance to review them but we will give you a call once they are reviewed by [place].   If we ordered any referrals today, please let us know if you have not heard from their office within the next week.   If you had any urgent prescriptions sent in today, please check with the pharmacy within an hour of our visit to make sure the prescription was transmitted appropriately.   Please try these tips to maintain a healthy lifestyle:  Eat at least 3 REAL meals and 1-2 snacks per day.  Aim for no more than 5 hours between eating.  If you eat breakfast, please do so within one hour of getting up.   Each meal should contain half fruits/vegetables, one quarter protein, and one quarter carbs (no bigger than a computer mouse)  Cut down on sweet beverages. This includes juice, soda, and sweet tea.   Drink at least 1 glass of water with each meal and aim for at least 8 glasses per day  Exercise at least 150 minutes every week.

## 2022-09-11 NOTE — Assessment & Plan Note (Addendum)
I had a lengthy discussion with patient today regarding his joint pain and symptom management.  He is in a difficult situation as he has end-stage arthritis that needs joint replacement however he does not currently have insurance.  He is working to get on disability and potentially Medicare however this may take several more months before he can get on this.  He has several nonopioid pain medications that he uses routinely to help manage pain.    He does have hydrocodone to use as needed for severe flare ups.  Uses this very sparingly as needed.  Last refill was over a year ago.  He tolerates reasonably well with no significant side effects.  His database was reviewed without red flags.  We will give small refill today.  Discussed with patient that I cannot give this on a chronic or recurring basis.  He voiced understanding.  We did discuss referral to sports medicine however he declined stating that he knows that he needs a joint replacement and he will get this once it is financially viable for him.   Handicap placard form filled out today.

## 2022-11-02 ENCOUNTER — Ambulatory Visit (INDEPENDENT_AMBULATORY_CARE_PROVIDER_SITE_OTHER): Payer: Self-pay | Admitting: Family Medicine

## 2022-11-02 ENCOUNTER — Encounter: Payer: Self-pay | Admitting: Family Medicine

## 2022-11-02 VITALS — BP 106/68 | HR 86 | Temp 97.5°F | Ht 69.0 in | Wt 258.6 lb

## 2022-11-02 DIAGNOSIS — J029 Acute pharyngitis, unspecified: Secondary | ICD-10-CM

## 2022-11-02 DIAGNOSIS — U071 COVID-19: Secondary | ICD-10-CM

## 2022-11-02 LAB — POC COVID19 BINAXNOW: SARS Coronavirus 2 Ag: POSITIVE — AB

## 2022-11-02 LAB — POCT RAPID STREP A (OFFICE): Rapid Strep A Screen: NEGATIVE

## 2022-11-02 MED ORDER — NIRMATRELVIR/RITONAVIR (PAXLOVID)TABLET: 3.0000 | ORAL_TABLET | Freq: Two times a day (BID) | ORAL | 0 refills | Status: AC

## 2022-11-02 NOTE — Progress Notes (Signed)
   Vincent Hernandez is a 57 y.o. male who presents today for an office visit.  Assessment/Plan:  COVID Plan of care COVID test positive.  No red flag signs or symptoms.  Overall reassuring exam today.  Symptoms are currently mild however have significantly worse in the last day or so.  Would be reasonable to start paxlovid.  We will send in prescription today.  He can use over-the-counter meds as needed as well.  Encouraged hydration.  We discussed reasons to return to care.  Follow-up as needed.    Subjective:  HPI:  See assessment / plan for status of chronic condtions. He is here today with a couple of days of nasal congestion, sore throat, and cough. Symptoms seem to be getting worse. No fevers or chills. Some phlegm production.  Tried mucinex without much improvement. Some worsening migraines.        Objective:  Physical Exam: BP 106/68   Pulse 86   Temp (!) 97.5 F (36.4 C) (Temporal)   Ht 5\' 9"  (1.753 m)   Wt 258 lb 9.6 oz (117.3 kg)   SpO2 93%   BMI 38.19 kg/m   Gen: No acute distress, resting comfortably CV: Regular rate and rhythm with no murmurs appreciated Pulm: Normal work of breathing, clear to auscultation bilaterally with no crackles, wheezes, or rhonchi Neuro: Grossly normal, moves all extremities Psych: Normal affect and thought content      Eleonore Shippee M. Jimmey Ralph, MD 11/02/2022 12:06 PM

## 2022-11-02 NOTE — Patient Instructions (Signed)
It was very nice to see you today!  You have COVID.  Please start the Paxlovid.  Let us know if not improved in the next several days.  Return if symptoms worsen or fail to improve.   Take care, Dr Jimmey Ralph  PLEASE NOTE:  If you had any lab tests, please let us know if you have not heard back within a few days. You may see your results on mychart before we have a chance to review them but we will give you a call once they are reviewed by [place].   If we ordered any referrals today, please let us know if you have not heard from their office within the next week.   If you had any urgent prescriptions sent in today, please check with the pharmacy within an hour of our visit to make sure the prescription was transmitted appropriately.   Please try these tips to maintain a healthy lifestyle:  Eat at least 3 REAL meals and 1-2 snacks per day.  Aim for no more than 5 hours between eating.  If you eat breakfast, please do so within one hour of getting up.   Each meal should contain half fruits/vegetables, one quarter protein, and one quarter carbs (no bigger than a computer mouse)  Cut down on sweet beverages. This includes juice, soda, and sweet tea.   Drink at least 1 glass of water with each meal and aim for at least 8 glasses per day  Exercise at least 150 minutes every week.

## 2023-06-20 ENCOUNTER — Ambulatory Visit: Payer: Self-pay | Admitting: Family Medicine

## 2023-06-20 ENCOUNTER — Encounter: Payer: Self-pay | Admitting: Family Medicine

## 2023-06-20 VITALS — BP 112/72 | HR 70 | Temp 97.2°F | Ht 69.0 in | Wt 279.0 lb

## 2023-06-20 DIAGNOSIS — N400 Enlarged prostate without lower urinary tract symptoms: Secondary | ICD-10-CM

## 2023-06-20 DIAGNOSIS — N529 Male erectile dysfunction, unspecified: Secondary | ICD-10-CM

## 2023-06-20 DIAGNOSIS — R972 Elevated prostate specific antigen [PSA]: Secondary | ICD-10-CM | POA: Diagnosis not present

## 2023-06-20 DIAGNOSIS — Z872 Personal history of diseases of the skin and subcutaneous tissue: Secondary | ICD-10-CM | POA: Diagnosis not present

## 2023-06-20 DIAGNOSIS — Z202 Contact with and (suspected) exposure to infections with a predominantly sexual mode of transmission: Secondary | ICD-10-CM

## 2023-06-20 LAB — PSA: PSA: 4.68 ng/mL — ABNORMAL HIGH (ref 0.10–4.00)

## 2023-06-20 MED ORDER — AZELASTINE HCL 0.1 % NA SOLN: 2.0000 | Freq: Two times a day (BID) | NASAL | 12 refills | Status: AC

## 2023-06-20 MED ORDER — AMOXICILLIN-POT CLAVULANATE 875-125 MG PO TABS: 1.0000 | ORAL_TABLET | Freq: Two times a day (BID) | ORAL | 0 refills | Status: DC

## 2023-06-20 MED ORDER — TADALAFIL 10 MG PO TABS
10.0000 mg | ORAL_TABLET | Freq: Every day | ORAL | 3 refills | Status: AC | PRN
Start: 2023-06-20 — End: ?

## 2023-06-20 MED ORDER — HYDROXYZINE HCL 25 MG PO TABS: 25.0000 mg | ORAL_TABLET | Freq: Three times a day (TID) | ORAL | 0 refills | Status: AC | PRN

## 2023-06-20 NOTE — Progress Notes (Signed)
   Vincent Hernandez is a 57 y.o. male who presents today for an office visit.  Assessment/Plan:  New/Acute Problems: Sinusitis  No red flags.  Will start Astelin  nasal spray.  He can use over-the-counter allergy meds as needed as well.  Will give pocket prescription for Augmentin  with instructions up to start and let symptoms fail to improve over the next several days.  No follow-up with us  in a few weeks if not improving.  May need referral to ENT.  STD Exposure  Most recent partner informed him that she had HSV. HE is not experiencing any symptoms. Will check labs today though discussed with patient would be unable to determine when he was exposed if labs were positive.   Chronic Problems Addressed Today: Elevated PSA Check PSA.  If increasing needs to go to urology.  We discussed this today in depth as per previous conversations as well.  Erectile dysfunction Stable on Cialis  10 mg daily as needed.  History of contact dermatitis Recently had poison ivy exposure few weeks ago.  Took some leftover hydroxyzine  which worked well.  Not currently having any rash that would like to have backup prescription of hydroxyzine  at home.  Will refill this today.  He tolerates well without any significant side effects.  We did discuss general poison ivy and contact dermatitis precautions as well     Subjective:  HPI:  See Assessment / plan for status of chronic conditions.  Patient is here today with sinus congestion.  This started about a month ago. He thinks this is due to his allergic rhinitis. He is having more headaches as well. He has tried some OTC nasal spray with some improvement. No fevers or chills.        Objective:  Physical Exam: BP 112/72   Pulse 70   Temp (!) 97.2 F (36.2 C) (Temporal)   Ht 5\' 9"  (1.753 m)   Wt 279 lb (126.6 kg)   SpO2 97%   BMI 41.20 kg/m   Gen: No acute distress, resting comfortably HEENT: tympanic membranes with clear effusion bilaterally. OP  erythematous.  Nasal mucosa erythematous bilaterally. CV: Regular rate and rhythm with no murmurs appreciated Pulm: Normal work of breathing, clear to auscultation bilaterally with no crackles, wheezes, or rhonchi Neuro: Grossly normal, moves all extremities Psych: Normal affect and thought content      Kamon Fahr M. Daneil Dunker, MD 06/20/2023 8:24 AM

## 2023-06-20 NOTE — Assessment & Plan Note (Signed)
Stable on Cialis 10 mg daily as needed.

## 2023-06-20 NOTE — Patient Instructions (Addendum)
 It was very nice to see you today!  We will check labs today.  I will refill your hydroxyzine  and Cialis .   Please start the nasal spray. Start the Augmentin  if not improving.   Return if symptoms worsen or fail to improve.   Take care, Dr Daneil Dunker  PLEASE NOTE:  If you had any lab tests, please let us  know if you have not heard back within a few days. You may see your results on mychart before we have a chance to review them but we will give you a call once they are reviewed by us .   If we ordered any referrals today, please let us  know if you have not heard from their office within the next week.   If you had any urgent prescriptions sent in today, please check with the pharmacy within an hour of our visit to make sure the prescription was transmitted appropriately.   Please try these tips to maintain a healthy lifestyle:  Eat at least 3 REAL meals and 1-2 snacks per day.  Aim for no more than 5 hours between eating.  If you eat breakfast, please do so within one hour of getting up.   Each meal should contain half fruits/vegetables, one quarter protein, and one quarter carbs (no bigger than a computer mouse)  Cut down on sweet beverages. This includes juice, soda, and sweet tea.   Drink at least 1 glass of water  with each meal and aim for at least 8 glasses per day  Exercise at least 150 minutes every week.

## 2023-06-20 NOTE — Assessment & Plan Note (Signed)
 Check PSA.  If increasing needs to go to urology.  We discussed this today in depth as per previous conversations as well.

## 2023-06-20 NOTE — Assessment & Plan Note (Signed)
 Recently had poison ivy exposure few weeks ago.  Took some leftover hydroxyzine  which worked well.  Not currently having any rash that would like to have backup prescription of hydroxyzine  at home.  Will refill this today.  He tolerates well without any significant side effects.  We did discuss general poison ivy and contact dermatitis precautions as well

## 2023-06-21 LAB — HSV(HERPES SIMPLEX VRS) I + II AB-IGG
HSV 1 IGG,TYPE SPECIFIC AB: 1.37 {index} — ABNORMAL HIGH
HSV 2 IGG,TYPE SPECIFIC AB: <0.9 {index}

## 2023-06-22 ENCOUNTER — Encounter: Payer: Self-pay | Admitting: Family Medicine

## 2023-06-22 NOTE — Progress Notes (Signed)
 His PSA is trending down.  We can recheck again in 6 to 12 months.  His HSV test indicates that he has been exposed to the cold sore virus but his test for genital herpes was negative.

## 2023-07-17 ENCOUNTER — Encounter: Payer: Self-pay | Admitting: Family Medicine

## 2023-07-17 ENCOUNTER — Ambulatory Visit: Admitting: Family Medicine

## 2023-07-17 VITALS — BP 114/71 | HR 67 | Temp 97.9°F | Ht 69.0 in | Wt 277.6 lb

## 2023-07-17 DIAGNOSIS — R972 Elevated prostate specific antigen [PSA]: Secondary | ICD-10-CM

## 2023-07-17 DIAGNOSIS — N400 Enlarged prostate without lower urinary tract symptoms: Secondary | ICD-10-CM

## 2023-07-17 MED ORDER — PREDNISONE 50 MG PO TABS: ORAL_TABLET | ORAL | 0 refills | Status: AC

## 2023-07-17 MED ORDER — LEVOFLOXACIN 500 MG PO TABS: 500.0000 mg | ORAL_TABLET | Freq: Every day | ORAL | 0 refills | Status: AC

## 2023-07-17 NOTE — Assessment & Plan Note (Signed)
 Stable on Cialis  10 mg daily.  Does not need refills today.  Will refill this at his last office visit here.

## 2023-07-17 NOTE — Assessment & Plan Note (Signed)
 Recent PSA downtrending.  We can recheck again next time we check labs.

## 2023-07-17 NOTE — Progress Notes (Signed)
   Vincent Hernandez is a 58 y.o. male who presents today for an office visit.  Assessment/Plan:  New/Acute Problems: Sinusitis Symptoms not fully resolved with recent course of Augmentin .  We did discuss alternative treatment options.  Will start Levaquin 500 mg daily for 7 days and prednisone  burst.  He has done with prednisone  in the past.  We did discuss referral to ENT however he like to hold off on this for now.  We discussed potential side effects of medications.  It is okay for him to continue taking Astelin  as needed.  Is also okay for him to use over-the-counter meds as needed as well.  He will follow-up with us  in 1 to 2 weeks if not improving and we can refer to ENT at that time if needed.  Chronic Problems Addressed Today: Elevated PSA Recent PSA downtrending.  We can recheck again next time we check labs.  BPH (benign prostatic hyperplasia) Stable on Cialis  10 mg daily.  Does not need refills today.  Will refill this at his last office visit here.     Subjective:  HPI:  See Assessment / plan for status of chronic conditions.  Patient here with persistent sinusitis.  I last saw him 3 to 4 weeks ago.  At that time we started Astelin  and Augmentin .  His symptoms did improve significantly on this and states that they got about 90% better however after finishing his course of antibiotics his symptoms have gradually returned over the last couple of weeks.  He is now having more cough than previous.  Also having some more throat irritation.  A little bit more fatigue.  No fevers or chills.  Cough is productive of phlegm.  No reported chest pain.        Objective:  Physical Exam: BP 114/71   Pulse 67   Temp 97.9 F (36.6 C) (Temporal)   Ht 5\' 9"  (1.753 m)   Wt 277 lb 9.6 oz (125.9 kg)   SpO2 96%   BMI 40.99 kg/m   Gen: No acute distress, resting comfortably HEENT: TMs clear.  OP erythematous.  Nasal mucosa erythematous and boggy laterally. CV: Regular rate and rhythm with no  murmurs appreciated Pulm: Normal work of breathing, clear to auscultation bilaterally with no crackles, wheezes, or rhonchi Neuro: Grossly normal, moves all extremities Psych: Normal affect and thought content      Vincent Hernandez M. Daneil Dunker, MD 07/17/2023 10:06 AM

## 2023-07-17 NOTE — Patient Instructions (Signed)
 It was very nice to see you today!  We will start Levaquin and prednisone .  You can continue with the nasal spray and other medications.  Let us  know if not improving in the next 1 to 2 weeks.  Return if symptoms worsen or fail to improve.   Take care, Dr Daneil Dunker  PLEASE NOTE:  If you had any lab tests, please let us  know if you have not heard back within a few days. You may see your results on mychart before we have a chance to review them but we will give you a call once they are reviewed by us .   If we ordered any referrals today, please let us  know if you have not heard from their office within the next week.   If you had any urgent prescriptions sent in today, please check with the pharmacy within an hour of our visit to make sure the prescription was transmitted appropriately.   Please try these tips to maintain a healthy lifestyle:  Eat at least 3 REAL meals and 1-2 snacks per day.  Aim for no more than 5 hours between eating.  If you eat breakfast, please do so within one hour of getting up.   Each meal should contain half fruits/vegetables, one quarter protein, and one quarter carbs (no bigger than a computer mouse)  Cut down on sweet beverages. This includes juice, soda, and sweet tea.   Drink at least 1 glass of water  with each meal and aim for at least 8 glasses per day  Exercise at least 150 minutes every week.

## 2024-02-12 ENCOUNTER — Ambulatory Visit (INDEPENDENT_AMBULATORY_CARE_PROVIDER_SITE_OTHER): Admitting: Family

## 2024-02-12 ENCOUNTER — Encounter: Payer: Self-pay | Admitting: Family

## 2024-02-12 VITALS — BP 138/84 | HR 93 | Temp 98.6°F | Ht 69.0 in | Wt 278.2 lb

## 2024-02-12 DIAGNOSIS — J342 Deviated nasal septum: Secondary | ICD-10-CM

## 2024-02-12 DIAGNOSIS — J329 Chronic sinusitis, unspecified: Secondary | ICD-10-CM

## 2024-02-12 MED ORDER — TRIAMCINOLONE ACETONIDE 55 MCG/ACT NA AERO: 1.0000 | INHALATION_SPRAY | Freq: Every day | NASAL | 2 refills | Status: AC

## 2024-02-12 NOTE — Progress Notes (Signed)
 Patient ID: Vincent Hernandez, male    DOB: Dec 12, 1965, 58 y.o.   MRN: [id number]  Chief Complaint  Patient presents with   Sinus Problem    Pt c/o nasal congestion and SOB, present for a couple of days.   Discussed the use of AI scribe software for clinical note transcription with the patient, who gave verbal consent to proceed.  History of Present Illness Vincent Hernandez is a 58 year old male who presents with nasal congestion and wheezing.  He has had nasal congestion and audible wheezing on forceful exhalation through his mouth since the weekend. Symptoms disturb his sleep, and his dog has been waking him at night due to the breathing sounds. He denies runny nose, cough, or fever. He notes a dry throat from breathing through his nose. He has prior nasal trauma with two nasal surgeries and chronically narrow nasal passages, and has been told this makes him prone to colds. He previously used Astelin  nasal spray for drainage but has not used it recently and has not tried other treatments for this episode. He has chronic migraines treated with rizatriptan  (Maxalt ). He woke today with a migraine described as a spike of pain in his right eye, consistent with his usual pattern. He is a former light smoker and denies current chemical or dust exposure. He has no known allergies but notes similar nasal and breathing symptoms most years from around Christmas through March, with variable severity. He takes a daily aspirin  and multiple over-the-counter supplements. He sometimes uses a humidifier in winter for dryness. Assessment and Plan Assessment & Plan Acute nasopharyngitis Congestion and wheezing without significant cough or fever, likely viral upper respiratory infection. No bacterial infection or COPD signs. Previous nasal surgeries and septal deviation may contribute. - Prescribed Nasacort  nasal spray, start with 1 spray each nostril bid x 3d, then qd until sx are improved. - Advised increased  fluid intake, at least 2L daily of caffeine free liquids. - Recommended humidifier use at night. - Suggested nasal saline nasal spray, 2 squirts tid or prn. - Discussed zinc  and vitamin C supplements during initial symptoms. - Advised mask and hand sanitizer use around new grandchild, avoid contact if coughing or any fever.  General health maintenance Discussed supplements and vitamins for health and immunity. Emphasized vitamin D3, zinc , and vitamin C. Advised against daily aspirin  due to bleeding risks. - Continue vitamin D3 supplementation. - Consider zinc  and vitamin C during illness onset. - Discontinue daily aspirin  unless advised by cardiologist. - Maintain healthy diet and lifestyle.  Subjective:    Outpatient Medications Prior to Visit  Medication Sig Dispense Refill   acetaminophen  (TYLENOL ) 650 MG CR tablet Take by mouth.     amoxicillin -clavulanate (AUGMENTIN ) 875-125 MG tablet Take 1 tablet by mouth 2 (two) times daily. 20 tablet 0   azelastine  (ASTELIN ) 0.1 % nasal spray Place 2 sprays into both nostrils 2 (two) times daily. 30 mL 12   cyclobenzaprine  (FLEXERIL ) 10 MG tablet TAKE ONE TABLET BY MOUTH THREE TIMES A DAY AS NEEDED FOR MUSCLE SPASMS 90 tablet 0   Echinacea 400 MG CAPS Take 800 mg by mouth daily.     Glucosamine-MSM-Hyaluronic Acd (JOINT HEALTH PO) Take 2 tablets by mouth daily.     hydrOXYzine  (ATARAX ) 25 MG tablet Take 1 tablet (25 mg total) by mouth 3 (three) times daily as needed for itching. 90 tablet 0   Multiple Vitamin (MULTIVITAMIN WITH MINERALS) TABS tablet Take 1 tablet by mouth daily.  Omega-3 Fatty Acids (FISH OIL) 1000 MG CAPS Take 4,000 mg by mouth daily.     predniSONE  (DELTASONE ) 50 MG tablet Take 1 tablet daily for 5 days. Then take 1/2 tablet daily for 2 days. 6 tablet 0   rizatriptan  (MAXALT ) 10 MG tablet Take 1 tablet (10 mg total) by mouth as needed for migraine. May repeat in 2 hours if needed 30 tablet 3   SUPER B COMPLEX/C CAPS Take 1  capsule by mouth daily.     tadalafil  (CIALIS ) 10 MG tablet Take 1 tablet (10 mg total) by mouth daily as needed for erectile dysfunction. 90 tablet 3   vitamin C (ASCORBIC ACID) 500 MG tablet Take 500 mg by mouth daily.     No facility-administered medications prior to visit.   Past Medical History:  Diagnosis Date   Arthritis    GERD (gastroesophageal reflux disease)    pt. denies   Herniated disc, cervical    through T1   Migraines    Past Surgical History:  Procedure Laterality Date   ANTERIOR CERVICAL DECOMP/DISCECTOMY FUSION N/A 09/01/2015   Procedure: ACDF C7-T1;  Surgeon: Donaciano Sprang, MD;  Location: [facility];  Service: Orthopedics;  Laterality: N/A;   BICEPS TENODESIS Right 06/19/2017   Dr. Gerome   CARPAL TUNNEL RELEASE Bilateral    left 2016, right 2016   FOOT ARTHROPLASTY Right 2011   screws   GANGLION CYST EXCISION     MENISCUS REPAIR Left 2016   ORIF TOE FRACTURE Left 11/21/2012   Procedure: OPEN REDUCTION INTERNAL FIXATION (ORIF) LEFT FOURTH METATARSAL NONUNION FRACTURE;  Surgeon: Norleen Armor, MD;  Location: [facility];  Service: Orthopedics;  Laterality: Left;   REPAIR QUADRICEPS / HAMSTRING MUSCLE Right 2014   ROTATOR CUFF REPAIR Right 06/19/2017   Dr. Gerome   SEPTOPLASTY  1995   x 3 nose surgeries   SHOULDER ARTHROSCOPY W/ ROTATOR CUFF REPAIR Left 2013   TOTAL KNEE ARTHROPLASTY Right 05/04/2017   Procedure: RIGHT TOTAL KNEE ARTHROPLASTY;  Surgeon: Gerome Charleston, MD;  Location: [facility];  Service: Orthopedics;  Laterality: Right;  Adductor Block   TOTAL KNEE ARTHROPLASTY Left 11/16/2017   Procedure: LEFT TOTAL KNEE ARTHROPLASTY;  Surgeon: Gerome Charleston, MD;  Location: [facility];  Service: Orthopedics;  Laterality: Left;   Allergies[1]    Objective:    Physical Exam Vitals and nursing note reviewed.  Constitutional:      General: He is not in acute distress.    Appearance: Normal appearance.  HENT:     Head: Normocephalic.     Right Ear:  Tympanic membrane and ear canal normal.     Left Ear: Tympanic membrane and ear canal normal.     Nose: Septal deviation, mucosal edema and congestion present. No rhinorrhea.     Right Sinus: No maxillary sinus tenderness or frontal sinus tenderness.     Left Sinus: No maxillary sinus tenderness or frontal sinus tenderness.     Mouth/Throat:     Mouth: Mucous membranes are moist.     Pharynx: Posterior oropharyngeal erythema (mild) present. No pharyngeal swelling, oropharyngeal exudate or uvula swelling.     Tonsils: No tonsillar exudate or tonsillar abscesses.  Cardiovascular:     Rate and Rhythm: Normal rate and regular rhythm.  Pulmonary:     Effort: Pulmonary effort is normal.     Breath sounds: Normal breath sounds.  Musculoskeletal:        General: Normal range of motion.     Cervical  back: Normal range of motion.  Lymphadenopathy:     Head:     Right side of head: No preauricular or posterior auricular adenopathy.     Left side of head: No preauricular or posterior auricular adenopathy.     Cervical: No cervical adenopathy.  Skin:    General: Skin is warm and dry.  Neurological:     Mental Status: He is alert and oriented to person, place, and time.  Psychiatric:        Mood and Affect: Mood normal.    BP 138/84 (BP Location: Left Arm, Patient Position: Sitting, Cuff Size: Large)   Pulse 93   Temp 98.6 F (37 C) (Temporal)   Ht 5' 9 (1.753 m)   Wt 278 lb 4 oz (126.2 kg)   SpO2 95%   BMI 41.09 kg/m  Wt Readings from Last 3 Encounters:  02/12/24 278 lb 4 oz (126.2 kg)  07/17/23 277 lb 9.6 oz (125.9 kg)  06/20/23 279 lb (126.6 kg)      Lucius Krabbe, NP     [1]  Allergies Allergen Reactions   Robaxin  [Methocarbamol ] Itching   Other     Other reaction(s): Other (See Comments) Locked up aches and pains   Tdap [Tetanus-Diphth-Acell Pertussis] Other (See Comments)    Locked up aches and pains   Tetanus Toxoid Swelling    Muscles locked up, became  stiff from vaccine
